# Patient Record
Sex: Male | Born: 1935 | Race: White | Hispanic: No | Marital: Married | Smoking: Never smoker
Health system: Southern US, Community
[De-identification: ages and names within clinical notes are randomized; demographics above are authoritative.]

## PROBLEM LIST (undated history)

## (undated) DIAGNOSIS — J189 Pneumonia, unspecified organism: Secondary | ICD-10-CM

## (undated) DIAGNOSIS — M19019 Primary osteoarthritis, unspecified shoulder: Secondary | ICD-10-CM

## (undated) DIAGNOSIS — M1990 Unspecified osteoarthritis, unspecified site: Secondary | ICD-10-CM

## (undated) DIAGNOSIS — I5022 Chronic systolic (congestive) heart failure: Secondary | ICD-10-CM

## (undated) DIAGNOSIS — J302 Other seasonal allergic rhinitis: Secondary | ICD-10-CM

## (undated) DIAGNOSIS — N400 Enlarged prostate without lower urinary tract symptoms: Secondary | ICD-10-CM

## (undated) DIAGNOSIS — I219 Acute myocardial infarction, unspecified: Secondary | ICD-10-CM

## (undated) DIAGNOSIS — K219 Gastro-esophageal reflux disease without esophagitis: Secondary | ICD-10-CM

## (undated) DIAGNOSIS — I10 Essential (primary) hypertension: Secondary | ICD-10-CM

## (undated) DIAGNOSIS — I255 Ischemic cardiomyopathy: Secondary | ICD-10-CM

## (undated) DIAGNOSIS — E785 Hyperlipidemia, unspecified: Secondary | ICD-10-CM

## (undated) DIAGNOSIS — I2510 Atherosclerotic heart disease of native coronary artery without angina pectoris: Secondary | ICD-10-CM

## (undated) HISTORY — DX: Chronic systolic (congestive) heart failure: I50.22

## (undated) HISTORY — PX: FOOT SURGERY: SHX648

---

## 1997-09-22 ENCOUNTER — Other Ambulatory Visit: Admission: RE | Admit: 1997-09-22 | Discharge: 1997-09-22 | Payer: Self-pay | Admitting: Gastroenterology

## 1997-09-23 DIAGNOSIS — D126 Benign neoplasm of colon, unspecified: Secondary | ICD-10-CM | POA: Insufficient documentation

## 1999-04-02 ENCOUNTER — Other Ambulatory Visit: Admission: RE | Admit: 1999-04-02 | Discharge: 1999-04-02 | Payer: Self-pay | Admitting: Gastroenterology

## 1999-04-02 ENCOUNTER — Encounter (INDEPENDENT_AMBULATORY_CARE_PROVIDER_SITE_OTHER): Payer: Self-pay | Admitting: Specialist

## 1999-05-05 ENCOUNTER — Emergency Department (HOSPITAL_COMMUNITY): Admission: EM | Admit: 1999-05-05 | Discharge: 1999-05-05 | Payer: Self-pay | Admitting: Emergency Medicine

## 2002-10-04 ENCOUNTER — Emergency Department (HOSPITAL_COMMUNITY): Admission: EM | Admit: 2002-10-04 | Discharge: 2002-10-04 | Payer: Self-pay | Admitting: Emergency Medicine

## 2006-09-26 ENCOUNTER — Ambulatory Visit: Payer: Self-pay | Admitting: Gastroenterology

## 2006-09-26 LAB — CONVERTED CEMR LAB
Alkaline Phosphatase: 74 units/L (ref 39–117)
BUN: 15 mg/dL (ref 6–23)
Basophils Absolute: 0 10*3/uL (ref 0.0–0.1)
Bilirubin, Direct: 0.1 mg/dL (ref 0.0–0.3)
CO2: 28 meq/L (ref 19–32)
Creatinine, Ser: 1.2 mg/dL (ref 0.4–1.5)
Eosinophils Absolute: 0.2 10*3/uL (ref 0.0–0.6)
Eosinophils Relative: 3.4 % (ref 0.0–5.0)
GFR calc Af Amer: 77 mL/min
Iron: 61 ug/dL (ref 42–165)
Lymphocytes Relative: 16.6 % (ref 12.0–46.0)
MCHC: 34.4 g/dL (ref 30.0–36.0)
MCV: 92.6 fL (ref 78.0–100.0)
Neutro Abs: 4.1 10*3/uL (ref 1.4–7.7)
Neutrophils Relative %: 70.8 % (ref 43.0–77.0)
Platelets: 187 10*3/uL (ref 150–400)
Potassium: 4 meq/L (ref 3.5–5.1)
RBC: 4.49 M/uL (ref 4.22–5.81)
Saturation Ratios: 20.2 % (ref 20.0–50.0)
Sodium: 142 meq/L (ref 135–145)
Total Bilirubin: 1 mg/dL (ref 0.3–1.2)
Total Protein: 6.8 g/dL (ref 6.0–8.3)
Transferrin: 215.4 mg/dL (ref >=212.0)

## 2006-10-23 ENCOUNTER — Ambulatory Visit: Payer: Self-pay | Admitting: Gastroenterology

## 2006-10-30 ENCOUNTER — Ambulatory Visit: Payer: Self-pay | Admitting: Gastroenterology

## 2006-10-30 DIAGNOSIS — K648 Other hemorrhoids: Secondary | ICD-10-CM | POA: Insufficient documentation

## 2007-03-19 DIAGNOSIS — E785 Hyperlipidemia, unspecified: Secondary | ICD-10-CM | POA: Insufficient documentation

## 2007-03-19 DIAGNOSIS — K623 Rectal prolapse: Secondary | ICD-10-CM | POA: Insufficient documentation

## 2007-03-19 DIAGNOSIS — K6289 Other specified diseases of anus and rectum: Secondary | ICD-10-CM | POA: Insufficient documentation

## 2007-03-19 DIAGNOSIS — K603 Anal fistula, unspecified: Secondary | ICD-10-CM | POA: Insufficient documentation

## 2007-03-19 DIAGNOSIS — K5900 Constipation, unspecified: Secondary | ICD-10-CM | POA: Insufficient documentation

## 2007-03-19 DIAGNOSIS — K625 Hemorrhage of anus and rectum: Secondary | ICD-10-CM | POA: Insufficient documentation

## 2007-03-19 DIAGNOSIS — I10 Essential (primary) hypertension: Secondary | ICD-10-CM | POA: Insufficient documentation

## 2007-03-19 DIAGNOSIS — J309 Allergic rhinitis, unspecified: Secondary | ICD-10-CM | POA: Insufficient documentation

## 2008-10-13 ENCOUNTER — Telehealth: Payer: Self-pay | Admitting: Gastroenterology

## 2008-10-15 ENCOUNTER — Telehealth: Payer: Self-pay | Admitting: Gastroenterology

## 2009-01-13 ENCOUNTER — Ambulatory Visit: Payer: Self-pay | Admitting: Gastroenterology

## 2009-10-27 ENCOUNTER — Telehealth: Payer: Self-pay | Admitting: Gastroenterology

## 2010-02-02 NOTE — Progress Notes (Signed)
Summary: TAlk to nurse   Phone Note Call from Patient Call back at Home Phone [phone] Call back at [phone] cell   Call For: Dr Jarold Motto Summary of Call: Is having a minor problem he wants to discuss with nurse a knot in a certain area, would rather tell a nurse. Initial call taken by: Leanor [facility],  October 27, 2009 11:12 AM  Follow-up for Phone Call        Patient c/o knot near his rectum he thinks it is a boil.  He says it is under the skin and not in the rectum.  He is advised to see his primary care as it is not on or in the rectum. Follow-up by: Darcey Nora RN, CGRN,  October 27, 2009 11:29 AM

## 2010-02-02 NOTE — Assessment & Plan Note (Signed)
Summary: YEARLY FOLLOW UP/YF    History of Present Illness Visit Type: Follow-up Visit Primary GI MD: Sheryn Bison MD FACP FAGA Primary Provider: Renford Dills, MD Chief Complaint: Hemmorhoids, lots of itching & rash History of Present Illness:   He denies GI complaints except for periodic rectal itching and discomfort without bleeding, rectal or abdominal pain. His bowel movements are regular and his last colonoscopy was 2 years ago. He denies other gastrointestinal complaints. He does have a history of BPH and is followed by urology.   GI Review of Systems      Denies abdominal pain, acid reflux, belching, bloating, chest pain, dysphagia with liquids, dysphagia with solids, heartburn, loss of appetite, nausea, vomiting, vomiting blood, weight loss, and  weight gain.      Reports hemorrhoids.     Denies anal fissure, black tarry stools, change in bowel habit, constipation, diarrhea, diverticulosis, fecal incontinence, heme positive stool, irritable bowel syndrome, jaundice, light color stool, liver problems, rectal bleeding, and  rectal pain. Preventive Screening-Counseling & Management  Alcohol-Tobacco     Smoking Status: never      Drug Use:  no.      Current Medications (verified): 1)  Analpram E 2.5-1 & 1 % Kit (Hydrocortisone Ace-Pramoxine) .... Apply To Rectal Area Tid 2)  Lipitor 10 Mg Tabs (Atorvastatin Calcium) .... Once Daily 3)  Advil Migraine 200 Mg Caps (Ibuprofen) .... As Needed  Allergies (verified): No Known Drug Allergies  Past History:  Past medical, surgical, family and social histories (including risk factors) reviewed for relevance to current acute and chronic problems.  Past Medical History: Reviewed history from 03/19/2007 and no changes required. Current Problems:  Hx of ADENOMATOUS COLONIC POLYP (ICD-211.3) PROCTITIS (ICD-569.49) HYPERLIPIDEMIA (ICD-272.4) ANAL FISTULA (ICD-565.1) Hx of PROLAPSE, RECTAL (ICD-569.1) HEMORRHOIDS, INTERNAL  (ICD-455.0) HISTORY OF PREVIOUS HAMARTOMATOUS ANAL POLYP  Past Surgical History: Tonsillectomy TURP  Family History: Reviewed history and no changes required. No FH of Colon Cancer:  Social History: Reviewed history and no changes required. Occupation: Retired Patient has never smoked.  Alcohol Use - no Daily Caffeine Use Illicit Drug Use - no Smoking Status:  never Drug Use:  no  Review of Systems       The patient complains of allergy/sinus, headaches-new, itching, muscle pains/cramps, and skin rash.  The patient denies anemia, anxiety-new, arthritis/joint pain, back pain, blood in urine, breast changes/lumps, change in vision, confusion, cough, coughing up blood, depression-new, fainting, fatigue, fever, hearing problems, heart murmur, heart rhythm changes, menstrual pain, night sweats, nosebleeds, pregnancy symptoms, shortness of breath, sleeping problems, sore throat, swelling of feet/legs, swollen lymph glands, thirst - excessive , urination - excessive , urination changes/pain, urine leakage, vision changes, and voice change.    Vital Signs:  Patient profile:   75 year old male Height:      71 inches Weight:      176.38 pounds BMI:     24.69 Pulse rate:   68 / minute Pulse rhythm:   regular BP sitting:   138 / 82  (left arm) Cuff size:   regular  Vitals Entered By: June McMurray CMA Duncan Dull) (January 13, 2009 1:58 PM)  Physical Exam  General:  Well developed, well nourished, no acute distress.healthy appearing.   Head:  Normocephalic and atraumatic. Eyes:  PERRLA, no icterus.exam deferred to patient's ophthalmologist.   Abdomen:  Soft, nontender and nondistended. No masses, hepatosplenomegaly or hernias noted. Normal bowel sounds. Rectal:  there is a posterior fistula in the midline which is noninflamed  and not draining, is not tender, and rectal exam otherwise is normal. There are hemorrhoids and anal canal which are noninflamed or bleeding. Stool is guaiac  negative. Prostate:  prostate is enlarged and somewhat irregular. Neurologic:  Alert and  oriented x4;  grossly normal neurologically.   Impression & Recommendations:  Problem # 1:  Hx of CONSTIPATION (ICD-564.00) Assessment Improved continue high-fiber diet and liberal p.o. fluids as tolerated  Problem # 2:  Hx of RECTAL BLEEDING (ICD-569.3) Assessment: Improved continue local anal tear and Analpram cream as needed  Problem # 3:  Hx of ADENOMATOUS COLONIC POLYP (ICD-211.3) Assessment: Unchanged he is up-to-date on his colonoscopy exams and I see no need to repeat at this time  Problem # 4:  ANAL FISTULA (ICD-565.1) Assessment: Comment Only Review of his records and colonoscopy exam shows no evidence of inflammatory bowel disease.  Patient Instructions: 1)  Copy sent to : Dr. Renford Dills 2)  Please continue current medications.  3)  Constipation and Hemorrhoids brochure given.  4)  Please schedule a follow-up appointment as needed.  5)  Urologic followup suggested 6)  The medication list was reviewed and reconciled.  All changed / newly prescribed medications were explained.  A complete medication list was provided to the patient / caregiver. Prescriptions: ANALPRAM E 2.5-1 & 1 % KIT (HYDROCORTISONE ACE-PRAMOXINE) apply to rectal area three times a day prn  #1 x 3   Entered by:   Ashok Cordia RN   Authorized by:   Mardella Layman MD [facility]   Signed by:   Ashok Cordia RN on 01/13/2009   Method used:   Electronically to        [facility]  [address] [phone] (retail)       [address]       [place], [place]  [address]       Ph: [phone]       Fax: [phone]   RxID:   [id number]

## 2010-05-18 NOTE — Assessment & Plan Note (Signed)
[facility]                         GASTROENTEROLOGY OFFICE NOTE   NAME:Warner, Jesus VONDERHAAR                     MRN:          [id number]  DATE:09/26/2006                            DOB:          [birth year]    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   [name] has had rectal itching, occasional bright red blood for several  months which seemed to respond to some topical steroid creams.  He has a  long history of recurrent colon polyps, rectal prolapse, internal  hemorrhoids, and proctitis.  His last colonoscopy exam was performed in  2001.  He has a negative family history of colon polyps.  He is having  regular bowel movements, denies abdominal pain, or any GI symptomatology  whatsoever.  His appetite is good, and his weight is stable.   His only other medical problem is mild hyperlipidemia, and he is on 10  mg of Lipitor a day.   FAMILY HISTORY:  Noncontributory.   SOCIAL HISTORY:  He is retired from the [employer] and has a high  school education.  He lives with his wife, does not smoke or drink.   REVIEW OF SYSTEMS:  Entirely noncontributory.   MEDICATIONS:  1. Lipitor.  2. Cortisone p.r.n.   He denies drug allergies.   EXAM:  He is a healthy-appearing white male in no distress appearing his  stated age.  ABDOMEN:  Entirely unremarkable.  Inspection of the rectum showed some mild perianal erythema but no  nodularity or maceration.  There is a posterior fistula which is  nondraining and is not inflamed.  Some obvious hemorrhoidal tissue in  the anal canal, but rectal exam showed no masses or tenderness with soft  stool which is trace guaiac positive.  MENTAL STATUS:  Clear.  PERIPHERAL EXTREMITIES:  Unremarkable.   ASSESSMENT:  1. History of previous colon polyps with need for followup      colonoscopy.  2. Probable recurrent proctitis with a  history of rectal prolapse      syndrome.  3. Chronic anal fistula.  4. Mild hyperlipidemia.   RECOMMENDATIONS:  1. Screening laboratory parameters since he has not had blood work      done in the last year.  2. Canasa 1 g suppository q.h.s. with p.r.n. local Analpram cream.  3. Followup colonoscopy exam at his convenience.  4. High fiber diet as tolerated.     Vania Rea. Jarold Motto, MD, Caleen Essex, FAGA  Electronically Signed    DRP/MedQ  DD: 09/26/2006  DT: 09/26/2006  Job #: [id number]   cc:   Deirdre Peer. Polite, M.D.

## 2012-09-06 ENCOUNTER — Inpatient Hospital Stay (HOSPITAL_COMMUNITY)
Admission: EM | Admit: 2012-09-06 | Discharge: 2012-09-09 | DRG: 246 | Disposition: A | Discharge status: Home / Self Care | Payer: Medicare Other | Attending: Cardiovascular Disease | Admitting: Cardiovascular Disease

## 2012-09-06 ENCOUNTER — Encounter (HOSPITAL_COMMUNITY)
Admission: EM | Disposition: A | Discharge status: Home / Self Care | Payer: Self-pay | Source: Home / Self Care | Attending: Cardiovascular Disease

## 2012-09-06 ENCOUNTER — Other Ambulatory Visit: Payer: Self-pay

## 2012-09-06 ENCOUNTER — Encounter (HOSPITAL_COMMUNITY): Payer: Self-pay | Admitting: Nurse Practitioner

## 2012-09-06 DIAGNOSIS — I2789 Other specified pulmonary heart diseases: Secondary | ICD-10-CM | POA: Diagnosis present

## 2012-09-06 DIAGNOSIS — I2582 Chronic total occlusion of coronary artery: Secondary | ICD-10-CM | POA: Diagnosis present

## 2012-09-06 DIAGNOSIS — I2589 Other forms of chronic ischemic heart disease: Secondary | ICD-10-CM | POA: Diagnosis present

## 2012-09-06 DIAGNOSIS — I4949 Other premature depolarization: Secondary | ICD-10-CM | POA: Diagnosis present

## 2012-09-06 DIAGNOSIS — I10 Essential (primary) hypertension: Secondary | ICD-10-CM | POA: Diagnosis present

## 2012-09-06 DIAGNOSIS — I5021 Acute systolic (congestive) heart failure: Secondary | ICD-10-CM

## 2012-09-06 DIAGNOSIS — I519 Heart disease, unspecified: Secondary | ICD-10-CM

## 2012-09-06 DIAGNOSIS — I252 Old myocardial infarction: Secondary | ICD-10-CM

## 2012-09-06 DIAGNOSIS — Z836 Family history of other diseases of the respiratory system: Secondary | ICD-10-CM

## 2012-09-06 DIAGNOSIS — E785 Hyperlipidemia, unspecified: Secondary | ICD-10-CM | POA: Diagnosis present

## 2012-09-06 DIAGNOSIS — J301 Allergic rhinitis due to pollen: Secondary | ICD-10-CM | POA: Diagnosis present

## 2012-09-06 DIAGNOSIS — Z7902 Long term (current) use of antithrombotics/antiplatelets: Secondary | ICD-10-CM

## 2012-09-06 DIAGNOSIS — Z79899 Other long term (current) drug therapy: Secondary | ICD-10-CM

## 2012-09-06 DIAGNOSIS — I509 Heart failure, unspecified: Secondary | ICD-10-CM | POA: Diagnosis present

## 2012-09-06 DIAGNOSIS — Z955 Presence of coronary angioplasty implant and graft: Secondary | ICD-10-CM

## 2012-09-06 DIAGNOSIS — I2109 ST elevation (STEMI) myocardial infarction involving other coronary artery of anterior wall: Secondary | ICD-10-CM

## 2012-09-06 DIAGNOSIS — I2510 Atherosclerotic heart disease of native coronary artery without angina pectoris: Secondary | ICD-10-CM

## 2012-09-06 DIAGNOSIS — I255 Ischemic cardiomyopathy: Secondary | ICD-10-CM | POA: Diagnosis present

## 2012-09-06 DIAGNOSIS — Z7982 Long term (current) use of aspirin: Secondary | ICD-10-CM

## 2012-09-06 DIAGNOSIS — N400 Enlarged prostate without lower urinary tract symptoms: Secondary | ICD-10-CM | POA: Diagnosis present

## 2012-09-06 HISTORY — DX: Essential (primary) hypertension: I10

## 2012-09-06 HISTORY — DX: Ischemic cardiomyopathy: I25.5

## 2012-09-06 HISTORY — PX: PERCUTANEOUS CORONARY STENT INTERVENTION (PCI-S): SHX5485

## 2012-09-06 HISTORY — DX: Hyperlipidemia, unspecified: E78.5

## 2012-09-06 HISTORY — DX: Other seasonal allergic rhinitis: J30.2

## 2012-09-06 HISTORY — PX: CORONARY ANGIOPLASTY WITH STENT PLACEMENT: SHX49

## 2012-09-06 HISTORY — PX: LEFT HEART CATHETERIZATION WITH CORONARY ANGIOGRAM: SHX5451

## 2012-09-06 HISTORY — DX: Atherosclerotic heart disease of native coronary artery without angina pectoris: I25.10

## 2012-09-06 HISTORY — DX: Benign prostatic hyperplasia without lower urinary tract symptoms: N40.0

## 2012-09-06 LAB — CBC
Hemoglobin: 13.7 g/dL (ref 13.0–17.0)
MCH: 31.4 pg (ref 26.0–34.0)
MCHC: 35.3 g/dL (ref 30.0–36.0)
RDW: 13.2 % (ref 11.5–15.5)

## 2012-09-06 LAB — POCT I-STAT, CHEM 8
Chloride: 106 mEq/L (ref 96–112)
Glucose, Bld: 152 mg/dL — ABNORMAL HIGH (ref 70–99)
HCT: 40 % (ref 39.0–52.0)
Hemoglobin: 13.6 g/dL (ref 13.0–17.0)
Potassium: 3.4 mEq/L — ABNORMAL LOW (ref 3.5–5.1)
Sodium: 141 mEq/L (ref 135–145)

## 2012-09-06 LAB — LIPID PANEL
Cholesterol: 144 mg/dL (ref 0–200)
HDL: 42 mg/dL (ref >39)
Total CHOL/HDL Ratio: 3.4 RATIO
Triglycerides: 115 mg/dL (ref <150)
VLDL: 23 mg/dL (ref 0–40)

## 2012-09-06 LAB — COMPREHENSIVE METABOLIC PANEL
ALT: 19 U/L (ref 0–53)
AST: 24 U/L (ref 0–37)
Albumin: 3.4 g/dL — ABNORMAL LOW (ref 3.5–5.2)
Albumin: 3.3 g/dL — ABNORMAL LOW (ref 3.5–5.2)
Alkaline Phosphatase: 80 U/L (ref 39–117)
BUN: 17 mg/dL (ref 6–23)
BUN: 14 mg/dL (ref 6–23)
CO2: 23 mEq/L (ref 19–32)
Chloride: 104 mEq/L (ref 96–112)
Chloride: 105 mEq/L (ref 96–112)
Creatinine, Ser: 0.89 mg/dL (ref 0.50–1.35)
GFR calc Af Amer: >90 mL/min (ref >90)
GFR calc non Af Amer: 71 mL/min — ABNORMAL LOW (ref >90)
GFR calc non Af Amer: 82 mL/min — ABNORMAL LOW (ref >90)
Glucose, Bld: 156 mg/dL — ABNORMAL HIGH (ref 70–99)
Glucose, Bld: 140 mg/dL — ABNORMAL HIGH (ref 70–99)
Potassium: 3.3 mEq/L — ABNORMAL LOW (ref 3.5–5.1)
Total Bilirubin: 0.4 mg/dL (ref 0.3–1.2)
Total Protein: 6.4 g/dL (ref 6.0–8.3)

## 2012-09-06 LAB — PROTIME-INR
INR: 1.07 (ref 0.00–1.49)
Prothrombin Time: 13.6 seconds (ref 11.6–15.2)

## 2012-09-06 LAB — CK TOTAL AND CKMB (NOT AT ARMC): Total CK: 135 U/L (ref 7–232)

## 2012-09-06 LAB — CBC WITH DIFFERENTIAL/PLATELET
Basophils Absolute: 0 10*3/uL (ref 0.0–0.1)
Lymphocytes Relative: 10 % — ABNORMAL LOW (ref 12–46)
Lymphs Abs: 0.7 10*3/uL (ref 0.7–4.0)
Neutro Abs: 6.5 10*3/uL (ref 1.7–7.7)
Neutrophils Relative %: 85 % — ABNORMAL HIGH (ref 43–77)
Platelets: 170 10*3/uL (ref 150–400)
RBC: 4.36 MIL/uL (ref 4.22–5.81)
RDW: 13.3 % (ref 11.5–15.5)
WBC: 7.7 10*3/uL (ref 4.0–10.5)

## 2012-09-06 LAB — HEMOGLOBIN A1C: Mean Plasma Glucose: 120 mg/dL — ABNORMAL HIGH (ref <117)

## 2012-09-06 LAB — APTT: aPTT: 25 seconds (ref 24–37)

## 2012-09-06 LAB — TROPONIN I
Troponin I: 0.88 ng/mL (ref <0.30)
Troponin I: 15.79 ng/mL (ref <0.30)

## 2012-09-06 LAB — POCT ACTIVATED CLOTTING TIME: Activated Clotting Time: 263 seconds

## 2012-09-06 LAB — PLATELET COUNT: Platelets: 171 10*3/uL (ref 150–400)

## 2012-09-06 SURGERY — LEFT HEART CATHETERIZATION WITH CORONARY ANGIOGRAM
Anesthesia: LOCAL

## 2012-09-06 MED ORDER — ASPIRIN EC 81 MG PO TBEC
81.0000 mg | DELAYED_RELEASE_TABLET | Freq: Every day | ORAL | Status: DC
  Administered 2012-09-07 – 2012-09-09 (×3): 81 mg via ORAL
  Filled 2012-09-06 (×3): qty 1

## 2012-09-06 MED ORDER — TICAGRELOR 90 MG PO TABS
ORAL_TABLET | ORAL | Status: AC
  Filled 2012-09-06: qty 2

## 2012-09-06 MED ORDER — NITROGLYCERIN 0.4 MG SL SUBL: 0.4000 mg | SUBLINGUAL_TABLET | SUBLINGUAL | Status: DC | PRN

## 2012-09-06 MED ORDER — POTASSIUM CHLORIDE CRYS ER 20 MEQ PO TBCR
40.0000 meq | EXTENDED_RELEASE_TABLET | Freq: Once | ORAL | Status: AC
  Administered 2012-09-06: 40 meq via ORAL
  Filled 2012-09-06: qty 2

## 2012-09-06 MED ORDER — OXYCODONE-ACETAMINOPHEN 5-325 MG PO TABS: 1.0000 | ORAL_TABLET | ORAL | Status: DC | PRN

## 2012-09-06 MED ORDER — NITROGLYCERIN 0.2 MG/ML ON CALL CATH LAB
INTRAVENOUS | Status: AC
  Filled 2012-09-06: qty 1

## 2012-09-06 MED ORDER — ALPRAZOLAM 0.25 MG PO TABS: 0.2500 mg | ORAL_TABLET | Freq: Two times a day (BID) | ORAL | Status: DC | PRN

## 2012-09-06 MED ORDER — FENTANYL CITRATE 0.05 MG/ML IJ SOLN
INTRAMUSCULAR | Status: AC
  Filled 2012-09-06: qty 2

## 2012-09-06 MED ORDER — LOSARTAN POTASSIUM 50 MG PO TABS
50.0000 mg | ORAL_TABLET | Freq: Every day | ORAL | Status: DC
  Administered 2012-09-06 – 2012-09-09 (×4): 50 mg via ORAL
  Filled 2012-09-06 (×4): qty 1

## 2012-09-06 MED ORDER — MORPHINE SULFATE 2 MG/ML IJ SOLN: 2.0000 mg | INTRAMUSCULAR | Status: DC | PRN

## 2012-09-06 MED ORDER — HEPARIN SODIUM (PORCINE) 1000 UNIT/ML IJ SOLN
INTRAMUSCULAR | Status: AC
  Filled 2012-09-06: qty 1

## 2012-09-06 MED ORDER — HEPARIN (PORCINE) IN NACL 2-0.9 UNIT/ML-% IJ SOLN
INTRAMUSCULAR | Status: AC
  Filled 2012-09-06: qty 1000

## 2012-09-06 MED ORDER — TICAGRELOR 90 MG PO TABS
90.0000 mg | ORAL_TABLET | Freq: Two times a day (BID) | ORAL | Status: DC
  Administered 2012-09-06 – 2012-09-09 (×7): 90 mg via ORAL
  Filled 2012-09-06 (×7): qty 1

## 2012-09-06 MED ORDER — ONDANSETRON HCL 4 MG/2ML IJ SOLN: 4.0000 mg | Freq: Four times a day (QID) | INTRAMUSCULAR | Status: DC | PRN

## 2012-09-06 MED ORDER — EPTIFIBATIDE 75 MG/100ML IV SOLN
2.0000 ug/kg/min | INTRAVENOUS | Status: AC
  Administered 2012-09-06: 2 ug/kg/min via INTRAVENOUS
  Filled 2012-09-06 (×2): qty 100

## 2012-09-06 MED ORDER — ACETAMINOPHEN 325 MG PO TABS: 650.0000 mg | ORAL_TABLET | ORAL | Status: DC | PRN

## 2012-09-06 MED ORDER — ZOLPIDEM TARTRATE 5 MG PO TABS
5.0000 mg | ORAL_TABLET | Freq: Every evening | ORAL | Status: DC | PRN
  Administered 2012-09-06 – 2012-09-08 (×3): 5 mg via ORAL
  Filled 2012-09-06 (×3): qty 1

## 2012-09-06 MED ORDER — ATORVASTATIN CALCIUM 80 MG PO TABS
80.0000 mg | ORAL_TABLET | Freq: Every day | ORAL | Status: DC
  Administered 2012-09-06 – 2012-09-09 (×4): 80 mg via ORAL
  Filled 2012-09-06 (×4): qty 1

## 2012-09-06 MED ORDER — SODIUM CHLORIDE 0.9 % IJ SOLN
3.0000 mL | Freq: Two times a day (BID) | INTRAMUSCULAR | Status: DC
  Administered 2012-09-06 – 2012-09-08 (×6): 3 mL via INTRAVENOUS

## 2012-09-06 MED ORDER — SODIUM CHLORIDE 0.9 % IV SOLN: INTRAVENOUS | Status: AC

## 2012-09-06 MED ORDER — LIDOCAINE HCL (PF) 1 % IJ SOLN
INTRAMUSCULAR | Status: AC
  Filled 2012-09-06: qty 30

## 2012-09-06 MED ORDER — MIDAZOLAM HCL 2 MG/2ML IJ SOLN
INTRAMUSCULAR | Status: AC
  Filled 2012-09-06: qty 2

## 2012-09-06 MED ORDER — EPTIFIBATIDE 75 MG/100ML IV SOLN
INTRAVENOUS | Status: AC
  Filled 2012-09-06: qty 100

## 2012-09-06 MED ORDER — SODIUM CHLORIDE 0.9 % IV SOLN: 250.0000 mL | INTRAVENOUS | Status: DC | PRN

## 2012-09-06 MED ORDER — VERAPAMIL HCL 2.5 MG/ML IV SOLN
INTRAVENOUS | Status: AC
  Filled 2012-09-06: qty 2

## 2012-09-06 MED ORDER — SODIUM CHLORIDE 0.9 % IJ SOLN
3.0000 mL | INTRAMUSCULAR | Status: DC | PRN
  Administered 2012-09-08: 3 mL via INTRAVENOUS

## 2012-09-06 NOTE — Progress Notes (Signed)
ANTICOAGULATION CONSULT NOTE - Initial Consult  Pharmacy Consult for integrilin x 12 hrs Indication: post PTCA  No Known Allergies  Patient Measurements: Height: 5\' 10"  (177.8 cm) Weight: 168 lb 8 oz (76.431 kg) IBW/kg (Calculated) : 73   Vital Signs: BP: 139/77 mmHg (09/04 1330) Pulse Rate: 62 (09/04 1330)  Labs:  Recent Labs  09/06/12 1147  HGB 13.7  HCT 38.8*  PLT 171  APTT 25  LABPROT 13.7  INR 1.07  CREATININE 1.00  CKTOTAL 135  CKMB 6.5*  TROPONINI 0.88*    Estimated Creatinine Clearance: 65.9 ml/min (by C-G formula based on Cr of 1).   Medical History: Past Medical History  Diagnosis Date  . Hyperlipidemia   . HTN (hypertension)   . Seasonal allergies   . BPH (benign prostatic hyperplasia)     Assessment: Jesus Warner is a 77 yo WM transferred from MD office to cath lab as a code STEMI.  He had a total occlusion of LAD successfully treated with PCI with DES.  He is to continue integrilin x 12 hrs post PTCA.  His baseline CBC is WNL.  Wt 76.4. Kg. Creat cl ~ 66 ml/min.  Integrilin started at ~ 1150 am.    Goal of Therapy:   Monitor platelets by anticoagulation protocol: Yes   Plan:  1. integrilin 2 mcg/kg/min x 12 hrs post PTCA 2. Monitor platelet count Herby Abraham, Pharm.D. [phone] 09/06/2012 1:41 PM   Len Childs T 09/06/2012,1:37 PM

## 2012-09-06 NOTE — H&P (Signed)
Patient ID: Jesus Warner MRN: [id number], DOB/AGE: 05-17-1935   Admit date: 09/06/2012   Primary Physician: Darrol Jump, MD Primary Cardiologist: new   Pt. Profile:  77 y/o male without prior cardiac hx who presents acutely with ant STEMI.  Problem List  Past Medical History  Diagnosis Date  . Hyperlipidemia   . HTN (hypertension)   . Seasonal allergies   . BPH (benign prostatic hyperplasia)     No past surgical history on file.   Allergies  Allergies not on file  HPI  77 y/o male w/o prior cardiac hx.  He was in his usoh until last PM, when while @ church, he began to experience sscp w/o associated Ss.  C/P eventually resolved, though he is unclear as to duration.  This AM, after eating breakfast, he developed severe retrosternal chest pressure assoc w diaphoresis.  He drove to his PCP's office where an ECG showed ant ST elevation.  EMS was called and he was treated with asa and ntg.  Following ntg, he became hypotensive.  He was taken to [facility] cath lab for [facility] STEMI.  He cont to report moderate c/p.  Home Medications  Asa 81 qd lotrisone cream bid Allegra 180 qd flonase 50 mcg 2 sp each nostril qd benicar 20 qd ---> recently switched to losartan per clinic note 7/29. lipitor 10 qd  Family History  Family History  Problem Relation Age of Onset  . Emphysema Father     died @ 22  . Cancer Mother     died @ 69   Social History  History   Social History  . Marital Status: Married    Spouse Name: N/A    Number of Children: N/A  . Years of Education: N/A   Occupational History  . Not on file.   Social History Main Topics  . Smoking status: Never Smoker   . Smokeless tobacco: Not on file  . Alcohol Use: No  . Drug Use: No  . Sexual Activity: Not on file   Other Topics Concern  . Not on file   Social History Narrative   Lives at home with wife in [place]    Review of Systems General:  No chills, fever, night sweats or weight changes.    Cardiovascular:  +++ chest pain, no dyspnea on exertion, edema, orthopnea, palpitations, paroxysmal nocturnal dyspnea. Dermatological: No rash, lesions/masses Respiratory: No cough, dyspnea Urologic: No hematuria, dysuria Abdominal:   No nausea, vomiting, diarrhea, bright red blood per rectum, melena, or hematemesis Neurologic:  No visual changes, wkns, changes in mental status. All other systems reviewed and are otherwise negative except as noted above.  Physical Exam  Afebrile, 54, 124/85, spo2 95, 2lpm  General: Pleasant, NAD, mildly diaphoretic Psych: Normal affect. Neuro: Alert and oriented X 3. Moves all extremities spontaneously. HEENT: Normal  Neck: Supple without bruits or JVD. Lungs:  Resp regular and unlabored, CTA. Heart: RRR no s3, s4, or murmurs. Abdomen: Soft, non-tender, non-distended, BS + x 4.  Extremities: No clubbing, cyanosis or edema. DP/PT/Radials 2+ and equal bilaterally.  Labs  All labs pending  Radiology/Studies  pending  ECG  Sb, 57, 1st deg hb, 4mm ant ST elevation with 2mm ST elevation in V5-6 as well.  ASSESSMENT AND PLAN  1.  Acute Ant STEMI:  Emergent cath ongoing.  Prelim - occluded LAD.  Cont asa, loading with brilinta in lab.  Inc lipitor to 80.  No BB in setting of baseline bradycardia.  Eventual  cardiac rehab.  F/U echo.  2.  HTN:  Cont ARB.  Follow.  3.  HL:  Check lipids/lft's.  Inc lipitor to 80.  4.  Seasonal Allergies: cont home meds.  Signed, Nicolasa Ducking, NP 09/06/2012, 11:42 AM'  Patient seen, examined. Available data reviewed. Agree with findings, assessment, and plan as outlined by Ward Givens, NP. Pt with no cardiac history presenting with acute anterior STEMI and ongoing CP. Exam reveals pale, diaphoretic, stoic gentleman. Lungs clear, heart brady and regular, no peripheral edema. Plan emergency cath and PCI. Further plans pending cath results.  Tonny Bollman, M.D. 09/06/2012 12:27 PM

## 2012-09-06 NOTE — Progress Notes (Signed)
Echocardiogram 2D Echocardiogram has been performed.  Orvella Digiulio 09/06/2012, 3:03 PM

## 2012-09-06 NOTE — Care Management Note (Signed)
    Page 1 of 1   09/06/2012     1:42:42 PM   CARE MANAGEMENT NOTE 09/06/2012  Patient:  Jesus Warner, Jesus Warner   Account Number:  [id number]  Date Initiated:  09/06/2012  Documentation initiated by:  Junius Creamer  Subjective/Objective Assessment:   adm w mi     Action/Plan:   lives w wife   Anticipated DC Date:     Anticipated DC Plan:        DC Planning Services  CM consult  Medication Assistance      Choice offered to / List presented to:             Status of service:   Medicare Important Message given?   (If response is "NO", the following Medicare IM given date fields will be blank) Date Medicare IM given:   Date Additional Medicare IM given:    Discharge Disposition:    Per UR Regulation:  Reviewed for med. necessity/level of care/duration of stay  If discussed at Long Length of Stay Meetings, dates discussed:    Comments:  9/4 1341 debbie Corazon Nickolas rn,bsn pt given 30day brilinta card and copay assist card. wife states has ins that covers meds.

## 2012-09-06 NOTE — CV Procedure (Signed)
   Cardiac Catheterization Procedure Note  Name: Jesus Warner MRN: [id number] DOB: Nov 11, 1935  Procedure: Left Heart Cath, Selective Coronary Angiography, LV angiography, Aspiration thrombectomy and stenting of the proximal LAD  Indication: Anterior STEMI  Procedural Details:  The right wrist was prepped, draped, and anesthetized with 1% lidocaine. Using the modified Seldinger technique, a 5/6 French slender sheath was introduced into the right radial artery. 3 mg of verapamil was administered through the sheath, weight-based unfractionated heparin was administered intravenously. Standard Judkins catheters were used for selective coronary angiography and left ventriculography. Catheter exchanges were performed over an exchange length guidewire.  PROCEDURAL FINDINGS Hemodynamics: AO 95/64 LV 96/14   Coronary angiography: Coronary dominance: right  Left mainstem: Arises from the left cusp, divides into the LAD and LCx. No significant stenosis.  Left anterior descending (LAD): Large vessel. Total occlusion at the origin of the 1st septal perforator and first diagonal. After reperfusion, the LAD is visualized and it wraps around the LV apex. There is discrete 40% distal LAD stenosis.  Left circumflex (LCx): small vessel. 80% ostial stenosis. Supplies only a single OM branch.  Right coronary artery (RCA): Large, dominant vessel. Widely patent throughout. Minimal irregularity in the mid-RCA is present. The PDA and PLA branches are patent without significant disease.  Left ventriculography: Left ventricular systolic function is moderately reduced with akinesis of the distal anterior, apical, and inferoapical walls. The LVEF is estimated at 40%.  PCI Note:  Following the diagnostic procedure, the decision was made to proceed with PCI. Heparin and intergrilin were administered for anticoagulation. He was loaded with brilinta 180 mg orally. Once a therapeutic ACT was achieved, a 6 [place]  XB-LAD guide catheter was inserted.  A BMW coronary guidewire was used to cross the lesion.  There was heavy thrombus at the lesion site. Aspiration thrombectomy was performed with mild improvement but residual thrombus was present.  The lesion was then stented with a 3.0x18 mm Xience Xpedition drug-eluting stent.  The stent was postdilated with a 3.5 mm noncompliant balloon.  Following PCI, there was 0% residual stenosis and TIMI-3 flow. Final angiography confirmed an excellent result. The patient tolerated the procedure well. There were no immediate procedural complications. A TR band was used for radial hemostasis. The patient was transferred to the post catheterization recovery area for further monitoring.  PCI Data: Vessel - LAD/Segment - mid Percent Stenosis (pre)  100 TIMI-flow 0 Stent 3.0x18 mm Xience DES Percent Stenosis (post) 0 TIMI-flow (post) 3  Final Conclusions:   1. Total LAD occlusion treated successfully with primary PCI using a DES platform  2. Moderate ostial LCx stenosis 3. Widely patent RCA 4. Moderate LV dysfunction consistent with anterior MI   Recommendations:  ASA, brilinta x 12 months. Post-MI medical therapy. LCx is a small vessel and I would anticipate medical therapy for residual CAD.  Tonny Bollman 09/06/2012, 12:30 PM

## 2012-09-06 NOTE — Interval H&P Note (Signed)
History and Physical Interval Note:  09/06/2012 12:29 PM  Jesus Warner  has presented today for surgery, with the diagnosis of stemi  The various methods of treatment have been discussed with the patient and family. After consideration of risks, benefits and other options for treatment, the patient has consented to  Procedure(s): LEFT HEART CATHETERIZATION WITH CORONARY ANGIOGRAM (N/A) PERCUTANEOUS CORONARY STENT INTERVENTION (PCI-S) as a surgical intervention .  The patient's history has been reviewed, patient examined, no change in status, stable for surgery.  I have reviewed the patient's chart and labs.  Questions were answered to the patient's satisfaction.    Cath Lab Visit (complete for each Cath Lab visit)  Clinical Evaluation Leading to the Procedure:   ACS: yes  Non-ACS:    Anginal Classification: CCS IV  Anti-ischemic medical therapy: No Therapy  Non-Invasive Test Results: No non-invasive testing performed  Prior CABG: No previous CABG         Tonny Bollman

## 2012-09-07 ENCOUNTER — Inpatient Hospital Stay (HOSPITAL_COMMUNITY): Payer: Medicare Other

## 2012-09-07 DIAGNOSIS — I255 Ischemic cardiomyopathy: Secondary | ICD-10-CM | POA: Diagnosis present

## 2012-09-07 DIAGNOSIS — I509 Heart failure, unspecified: Secondary | ICD-10-CM

## 2012-09-07 DIAGNOSIS — I5021 Acute systolic (congestive) heart failure: Secondary | ICD-10-CM

## 2012-09-07 DIAGNOSIS — I10 Essential (primary) hypertension: Secondary | ICD-10-CM

## 2012-09-07 LAB — CBC
Hemoglobin: 13.2 g/dL (ref 13.0–17.0)
MCH: 31.8 pg (ref 26.0–34.0)
Platelets: 174 10*3/uL (ref 150–400)
RBC: 4.15 MIL/uL — ABNORMAL LOW (ref 4.22–5.81)

## 2012-09-07 LAB — BASIC METABOLIC PANEL
CO2: 24 mEq/L (ref 19–32)
Calcium: 8.7 mg/dL (ref 8.4–10.5)
GFR calc non Af Amer: 67 mL/min — ABNORMAL LOW (ref >90)
Glucose, Bld: 103 mg/dL — ABNORMAL HIGH (ref 70–99)
Potassium: 4.2 mEq/L (ref 3.5–5.1)
Sodium: 138 mEq/L (ref 135–145)

## 2012-09-07 LAB — LIPID PANEL
Cholesterol: 153 mg/dL (ref 0–200)
Triglycerides: 136 mg/dL (ref <150)

## 2012-09-07 LAB — TSH: TSH: 1.873 u[IU]/mL (ref 0.350–4.500)

## 2012-09-07 MED ORDER — SPIRONOLACTONE 25 MG PO TABS
25.0000 mg | ORAL_TABLET | Freq: Every day | ORAL | Status: DC
  Administered 2012-09-07 – 2012-09-09 (×3): 25 mg via ORAL
  Filled 2012-09-07 (×3): qty 1

## 2012-09-07 MED ORDER — CARVEDILOL 3.125 MG PO TABS
3.1250 mg | ORAL_TABLET | Freq: Two times a day (BID) | ORAL | Status: DC
  Administered 2012-09-07 – 2012-09-09 (×5): 3.125 mg via ORAL
  Filled 2012-09-07 (×7): qty 1

## 2012-09-07 NOTE — Progress Notes (Signed)
Report was called to RN on unit 3 West. Pt will transfer to room 3 West 23. Wife is at the bedside for transfer.

## 2012-09-07 NOTE — Progress Notes (Signed)
TELEMETRY: Reviewed telemetry pt in NSR, 4 beat run of PVCs.Ceasar Mons Vitals:   09/07/12 0300 09/07/12 0400 09/07/12 0500 09/07/12 0600  BP: 104/62 133/73 139/73 144/79  Pulse: 59 52 58 53  Temp:  98.2 F (36.8 C)    TempSrc:  Oral    Resp: 24 26 11 17   Height:      Weight:   168 lb 11.2 oz (76.522 kg)   SpO2: 96% 96% 97% 95%    Intake/Output Summary (Last 24 hours) at 09/07/12 0744 Last data filed at 09/07/12 0500  Gross per 24 hour  Intake 1599.05 ml  Output   1800 ml  Net -200.95 ml    SUBJECTIVE Feels well. No further chest pain. Denies SOB.  LABS: Basic Metabolic Panel:  Recent Labs  96/04/54 1701 09/07/12 0140  NA 137 138  K 3.7 4.2  CL 105 106  CO2 23 24  GLUCOSE 140* 103*  BUN 14 13  CREATININE 0.89 1.05  CALCIUM 8.7 8.7   Liver Function Tests:  Recent Labs  09/06/12 1147 09/06/12 1701  AST 24 65*  ALT 19 22  ALKPHOS 76 80  BILITOT 0.5 0.4  PROT 6.4 6.1  ALBUMIN 3.4* 3.3*   CBC:  Recent Labs  09/06/12 1701 09/06/12 2127 09/07/12 0140  WBC 7.7  --  10.0  NEUTROABS 6.5  --   --   HGB 13.7  --  13.2  HCT 38.8*  --  37.1*  MCV 89.0  --  89.4  PLT 170 171 174   Cardiac Enzymes:  Recent Labs  09/06/12 1147 09/06/12 1701 09/06/12 1930 09/07/12 0140  CKTOTAL 135  --   --   --   CKMB 6.5*  --   --   --   TROPONINI 0.88* 15.79* 16.91* 18.63*   Hemoglobin A1C:  Recent Labs  09/06/12 1147  HGBA1C 5.8*   Fasting Lipid Panel:  Recent Labs  09/07/12 0140  CHOL 153  HDL 41  LDLCALC 85  TRIG 136  CHOLHDL 3.7   Thyroid Function Tests:  Recent Labs  09/06/12 1701  TSH 1.873   Ecg: NSR, T wave abnormality c/w anterior ischemia  Radiology/Studies:  No results found.  Echo: Study Conclusions  - Left ventricle: The cavity size was normal. Wall thickness was normal. Systolic function was severely reduced. The estimated ejection fraction was in the range of 25% to 30%. Mid to apical anteroseptal and inferoseptal  akinesis, apical lateral akinesis, mid to apical anterior severe hypokinesis. Akinesis of the true apex. Doppler parameters are consistent with abnormal left ventricular relaxation (grade 1 diastolic dysfunction). - Aortic valve: There was no stenosis. - Mitral valve: Trivial regurgitation. - Right ventricle: The cavity size was normal. Systolic function was mildly reduced. - Tricuspid valve: Peak RV-RA gradient: 28mm Hg (S). - Pulmonary arteries: PA peak pressure: 38mm Hg (S). - Systemic veins: IVC not visualized. Impressions:  - Normal LV size with EF 25-30%. Wall motion abnormalities as noted above. Normal RV size with mildly decreased systolic function. Mild pulmonary hypertension.   PHYSICAL EXAM General: Well developed, well nourished, in no acute distress. Head: Normal Neck: Negative for carotid bruits. JVD not elevated. Lungs: Clear bilaterally to auscultation without wheezes, rales, or rhonchi. Breathing is unlabored. Heart: RRR S1 S2 without murmurs, rubs, or gallops.  Abdomen: Soft, non-tender, non-distended with normoactive bowel sounds. No hepatomegaly. No rebound/guarding. No obvious abdominal masses. Msk:  Strength and tone appears normal for age. Extremities: No clubbing, cyanosis or  edema.  Distal pedal pulses are 2+ and equal bilaterally. Extensive bruising right forearm without significant hematoma. Neuro: Alert and oriented X 3. Moves all extremities spontaneously. Psych:  Responds to questions appropriately with a normal affect.  ASSESSMENT AND PLAN: 1. Anterior STEMI. S/p DES of LAD. On ASA and Brilinta. Completed 12 hours of IV Integrelin for heavy thrombus burden. Clinically doing well. Will start low dose beta blocker.  2. Acute systolic CHF secondary to ischemic cardiomyopathy. EF 40% by cath. 25-30% by Echo. On ARB. Add low dose Coreg ( watch for bradycardia) and aldactone. Check Cxr. 3. Hyperlipidemia. On high dose lipitor. 4. HTN  controlled.  Disposition. Will transfer to floor today.  Active Problems:   HYPERLIPIDEMIA   HYPERTENSION   ST elevation myocardial infarction (STEMI) of anterior wall   Acute systolic CHF (congestive heart failure)   Cardiomyopathy, ischemic    Signed, Jarome Trull [place] MD,FACC 09/07/2012 7:44 AM

## 2012-09-07 NOTE — Progress Notes (Signed)
CARDIAC REHAB PHASE I   PRE:  Rate/Rhythm: 91SR  BP:  Supine:   Sitting: 113/67  Standing:    SaO2: 95%RA  MODE:  Ambulation: 550 ft   POST:  Rate/Rhythm: 85SR  BP:  Supine:   Sitting: 116/67  Standing:    SaO2: 99%RA 1405-1516 Pt walked 550 ft with hand held asst. Gait steady. No CP. Tolerated well. Education completed re MI and CHF. Discussed low sodium diet and daily weights with low EF. Gave CHF packet. Discussed CRP 2 and permission given to refer to [place]. Wife present for ed.    Luetta Nutting, RN BSN  09/07/2012 3:12 PM

## 2012-09-08 LAB — BASIC METABOLIC PANEL
CO2: 25 mEq/L (ref 19–32)
Calcium: 9.1 mg/dL (ref 8.4–10.5)
Creatinine, Ser: 1.16 mg/dL (ref 0.50–1.35)
GFR calc non Af Amer: 60 mL/min — ABNORMAL LOW (ref >90)
Glucose, Bld: 89 mg/dL (ref 70–99)
Sodium: 138 mEq/L (ref 135–145)

## 2012-09-08 NOTE — Progress Notes (Signed)
Subjective:  Patient has not had any chest pain.  He tolerated ambulation yesterday with cardiac rehabilitation.  He was started on spironolactone yesterday.  Blood pressure today slightly soft 103/58.  Denies dizziness.  EKG today shows further is having changes of recent anterior wall STEMI.  Significant myocardial infarction by troponin levels.  Troponin still rising when last checked.  Objective:  Vital Signs in the last 24 hours: Temp:  [97.7 F (36.5 C)-98.2 F (36.8 C)] 98.1 F (36.7 C) (09/06 0500) Pulse Rate:  [59-79] 70 (09/06 0500) Resp:  [16-20] 16 (09/06 0500) BP: (103-139)/(58-101) 103/58 mmHg (09/06 0500) SpO2:  [95 %-100 %] 95 % (09/06 0500) Weight:  [169 lb 15.6 oz (77.1 kg)] 169 lb 15.6 oz (77.1 kg) (09/06 0500)  Intake/Output from previous day: 09/05 0701 - 09/06 0700 In: 840 [P.O.:840] Out: -  Intake/Output from this shift: Total I/O In: 240 [P.O.:240] Out: -   . aspirin EC  81 mg Oral Daily  . atorvastatin  80 mg Oral q1800  . carvedilol  3.125 mg Oral BID WC  . losartan  50 mg Oral Daily  . sodium chloride  3 mL Intravenous Q12H  . spironolactone  25 mg Oral Daily  . Ticagrelor  90 mg Oral BID      Physical Exam: The patient appears to be in no distress.  Head and neck exam reveals that the pupils are equal and reactive.  The extraocular movements are full.  There is no scleral icterus.  Mouth and pharynx are benign.  No lymphadenopathy.  No carotid bruits.  The jugular venous pressure is normal.  Thyroid is not enlarged or tender.  Chest is clear to percussion and auscultation.  No rales or rhonchi.  Expansion of the chest is symmetrical.  Heart reveals no abnormal lift or heave.  First and second heart sounds are normal.  There is no murmur gallop rub or click.  The abdomen is soft and nontender.  Bowel sounds are normoactive.  There is no hepatosplenomegaly or mass.  There are no abdominal bruits.  Extremities reveal no phlebitis or edema.   Pedal pulses are good.  There is no cyanosis or clubbing.  The right radial pulse is strong.  Moderate ecchymosis of right forearm.  Neurologic exam is normal strength and no lateralizing weakness.  No sensory deficits.  Integument reveals no rash  Lab Results:  Recent Labs  09/06/12 1701 09/06/12 2127 09/07/12 0140  WBC 7.7  --  10.0  HGB 13.7  --  13.2  PLT 170 171 174    Recent Labs  09/06/12 1701 09/07/12 0140  NA 137 138  K 3.7 4.2  CL 105 106  CO2 23 24  GLUCOSE 140* 103*  BUN 14 13  CREATININE 0.89 1.05    Recent Labs  09/06/12 1930 09/07/12 0140  TROPONINI 16.91* 18.63*   Hepatic Function Panel  Recent Labs  09/06/12 1701  PROT 6.1  ALBUMIN 3.3*  AST 65*  ALT 22  ALKPHOS 80  BILITOT 0.4    Recent Labs  09/07/12 0140  CHOL 153   No results found for this basename: PROTIME,  in the last 72 hours  Imaging: Dg Chest 2 View  09/07/2012   *RADIOLOGY REPORT*  Clinical Data: Post cardiac catheterization for STEMI  CHEST - 2 VIEW  Comparison: None.  Findings: Normal cardiac silhouette.  There is mild tortuosity/unfolding and possible ectasia of the thoracic aorta. No focal airspace opacities.  No pleural effusion or  pneumothorax. No evidence of edema.  No acute osseous abnormalities.  Mild degenerative change of the right glenohumeral joint.  IMPRESSION: 1.  No acute cardiopulmonary disease, specifically, no evidence of edema.  2.  Mild tortuosity and possible slight ectasia of the thoracic aorta.  Correlation with findings from recent cardiac catheterization is recommended.   Original Report Authenticated By: Tacey Ruiz, MD    Cardiac Studies: Telemetry shows normal sinus rhythm with occasional PVCs Assessment/Plan:  1. Anterior STEMI. S/p DES of LAD. On ASA and Brilinta. Completed 12 hours of IV Integrelin for heavy thrombus burden. Clinically doing well.  On low dose beta blocker.  2. Acute systolic CHF secondary to ischemic cardiomyopathy. EF 40%  by cath. 25-30% by Echo. On ARB.  On low dose Coreg ( watch for bradycardia) and aldactone.  Heart size normal. 3. Hyperlipidemia. On high dose lipitor.  4. HTN presently blood pressure is soft.  Plan: We'll get a followup troponin today.  We'll get daily basal metabolic panel starting today to be sure that he does not develop hyperkalemia on spironolactone and ARB. Continue cardiac rehabilitation today and I spoke with him about outpatient rehabilitation. Anticipate home tomorrow if stable.  Recheck EKG in a.m.   LOS: 2 days    Cassell Clement 09/08/2012, 9:13 AM

## 2012-09-08 NOTE — Progress Notes (Signed)
CARDIAC REHAB PHASE I   PRE:  Rate/Rhythm: 77 sinus  BP:  Sitting: 114/72     MODE:  Ambulation: 550 ft   POST:  Rate/Rhythem: 86  BP:  Sitting: 124/66   SaO2: 99 RA  Pt ambulated 550 ft with assist x1.  Pt tolerated walk well without complaints.  Pt had no follow questions from yesterday's education.  Pt returned to chair after walk with call bell in reach.  He was encouraged to continue to walk over weekend. Fabio Pierce, MA, ACSM RCEP [phone]  Jesus Warner

## 2012-09-09 ENCOUNTER — Encounter (HOSPITAL_COMMUNITY): Payer: Self-pay | Admitting: Physician Assistant

## 2012-09-09 DIAGNOSIS — I2510 Atherosclerotic heart disease of native coronary artery without angina pectoris: Secondary | ICD-10-CM

## 2012-09-09 LAB — BASIC METABOLIC PANEL
BUN: 18 mg/dL (ref 6–23)
Calcium: 8.9 mg/dL (ref 8.4–10.5)
Creatinine, Ser: 1.07 mg/dL (ref 0.50–1.35)
GFR calc Af Amer: 76 mL/min — ABNORMAL LOW (ref >90)
GFR calc non Af Amer: 66 mL/min — ABNORMAL LOW (ref >90)
Glucose, Bld: 99 mg/dL (ref 70–99)

## 2012-09-09 MED ORDER — LOSARTAN POTASSIUM 50 MG PO TABS: 50.0000 mg | ORAL_TABLET | Freq: Every day | ORAL | Status: DC

## 2012-09-09 MED ORDER — ATORVASTATIN CALCIUM 80 MG PO TABS: 80.0000 mg | ORAL_TABLET | Freq: Every day | ORAL | Status: DC

## 2012-09-09 MED ORDER — SPIRONOLACTONE 25 MG PO TABS: 25.0000 mg | ORAL_TABLET | Freq: Every day | ORAL | Status: DC

## 2012-09-09 MED ORDER — ASPIRIN 81 MG PO TBEC: 81.0000 mg | DELAYED_RELEASE_TABLET | Freq: Every day | ORAL | Status: DC

## 2012-09-09 MED ORDER — NITROGLYCERIN 0.4 MG SL SUBL: 0.4000 mg | SUBLINGUAL_TABLET | SUBLINGUAL | Status: DC | PRN

## 2012-09-09 MED ORDER — CARVEDILOL 3.125 MG PO TABS: 3.1250 mg | ORAL_TABLET | Freq: Two times a day (BID) | ORAL | Status: DC

## 2012-09-09 MED ORDER — TICAGRELOR 90 MG PO TABS: 90.0000 mg | ORAL_TABLET | Freq: Two times a day (BID) | ORAL | Status: DC

## 2012-09-09 NOTE — Discharge Summary (Signed)
Discharge Summary   Patient ID: Jesus Warner,  MRN: [id number], DOB/AGE: 75-Feb-1937 75 y.o.  Admit date: 09/06/2012 Discharge date: 09/09/2012  Primary Physician: Katy Apo, MD Primary Cardiologist: Ellis Parents- M. Excell Seltzer, MD   Discharge Diagnoses Principal Problem:   ST elevation myocardial infarction (STEMI) of anterior wall  - S/p cardiac catheterization 09/06/12- total mid LAD occlusion s/p DES, 40% distal LAD, 80% ostial LCx (small vessel supplying single OM branch), widely patent RCA; akinesis of distal anterior, apical and inferoapical walls; EF 40%  - DAPT- ASA/Brilinta x 12 months Active Problems:   CAD (coronary artery disease), native coronary artery  - As above, discharged on ASA/Brilinta/ARB/BB/statin/NTG SL PRN   Cardiomyopathy, ischemic  - EF 40% on cath  - Follow-up 2D echo: EF 25-30%, mid to apical anteroseptal and inferoseptal akinesis, apical lateral akinesis, mid apical anterior severe hypokinesis. Akinesis of the true apex. Doppler parameters are consistent with abnormal left ventricular relaxation (grade 1 diastolic dysfunction). Mildly decreased RV systolic function, normal size, PASP 38 mmHg.  - Spironolactone added to ARB and BB therapy  - Remained euvolemic through admission   HYPERLIPIDEMIA  - Atorvastatin increased to 80mg  daily  - Check LFTs, lipid panel in 6 weeks   HYPERTENSION  - Well-controlled on above antihypertensives  Allergies No Known Allergies  Diagnostic Studies/Procedures  CARDIAC CATHETERIZATION + PERCUTANEOUS CORONARY INTERVENTION - 09/06/12  Hemodynamics:  AO 95/64  LV 96/14   Coronary angiography:  Coronary dominance: right  Left mainstem: Arises from the left cusp, divides into the LAD and LCx. No significant stenosis.  Left anterior descending (LAD): Large vessel. Total occlusion at the origin of the 1st septal perforator and first diagonal. After reperfusion, the LAD is visualized and it wraps around the LV apex. There is  discrete 40% distal LAD stenosis.  Left circumflex (LCx): small vessel. 80% ostial stenosis. Supplies only a single OM branch.  Right coronary artery (RCA): Large, dominant vessel. Widely patent throughout. Minimal irregularity in the mid-RCA is present. The PDA and PLA branches are patent without significant disease.  Left ventriculography: Left ventricular systolic function is moderately reduced with akinesis of the distal anterior, apical, and inferoapical walls. The LVEF is estimated at 40%.   PCI Data:  Vessel - LAD/Segment - mid  Percent Stenosis (pre) 100  TIMI-flow 0  Stent 3.0x18 mm Xience DES  Percent Stenosis (post) 0  TIMI-flow (post) 3   Final Conclusions:  1. Total LAD occlusion treated successfully with primary PCI using a DES platform  2. Moderate ostial LCx stenosis  3. Widely patent RCA  4. Moderate LV dysfunction consistent with anterior MI   TRANSTHORACIC ECHOCARDIOGRAM - 09/06/12  Study Conclusions  - Left ventricle: The cavity size was normal. Wall thickness was normal. Systolic function was severely reduced. The estimated ejection fraction was in the range of 25% to 30%. Mid to apical anteroseptal and inferoseptal akinesis, apical lateral akinesis, mid to apical anterior severe hypokinesis. Akinesis of the true apex. Doppler parameters are consistent with abnormal left ventricular relaxation (grade 1 diastolic dysfunction).  - Aortic valve: There was no stenosis. - Mitral valve: Trivial regurgitation. - Right ventricle: The cavity size was normal. Systolic function was mildly reduced.  - Tricuspid valve: Peak RV-RA gradient: 28mm Hg (S). - Pulmonary arteries: PA peak pressure: 38mm Hg (S). - Systemic veins: IVC not visualized.  PA/LATERAL CHEST X-RAY - 09/07/12  IMPRESSION:  1. No acute cardiopulmonary disease, specifically, no evidence of edema.  2. Mild tortuosity and possible slight  ectasia of the thoracic aorta. Correlation with findings from recent cardiac  catheterization is recommended.  History of Present Illness  Jesus Warner is a 77 y.o. male who was admitted to [facility] hospital 9/4 to 9/7 with the above problem list.   He has no prior cardiac history. He had been in his USOH until the evening prior to admission when he endorsed substernal chest pain w/o associated symptoms while at church. Duration was unclear, however the discomfort eventually resolved. The following morning after eating breakfast, he experienced severe substernal chest pressure w/ associated diaphoresis. He drove to his PCP's office where EKG indicated anterior ST elevations. He was given a full dose ASA and NTG. He become hypotensive due to the NTG. Code STEMI was called, and he arrived at the [facility] cath lab.   Hospital Course   The full angiographic and PCI data is outlined above. Notably, this revealed an occluded mid LAD lesion. He was loaded with Brilinta and a DES was successfully placed to the lesion. The patient's EF was noted to be 40% with several WMAs. He did tolerate the procedure well without complications. The recommendation was made to continue DAPT- ASA/Brilinta x 12 months. He was transferred to CCU in good condition.  Serial troponins elevated correspondingly as noted below. Hgb A1C < 6.5%, TSH WNL. Lipid panel indicated fair lipid control. He underwent a post-cath echocardiogram as above which indicated an EF of 25-30% and multiple WMAs. RV systolic function was mildly reduced. Mild pulmonary HTN was appreciated. His outpatient Losartan was reduced to 50mg  due to labile BPs. BP improved, and he was started on BB and spironolactone in addition to ARB therapy. Atorvastatin was increased to 80mg  daily. He remained stable throughout the admission and ambulated well with cardiac rehab. There were no appreciable arrhythmias on telemetry. He was evaluated by Dr. Patty Sermons today and deemed to be stable for discharge.   Given the patient's newly diagnosed  ischemic cardiomyopathy and EF <35%, the topic of temporary LifeVest placement prior to follow-up echo in ~40 days after reperfusion and optimized medical therapy was discussed with Dr. Johney Frame. He felt the patient would be an appropriate candidate, but that the data for benefit of prophylactic LifeVest therapy post-MI with reduced EF is unavailable. The reasons against placement can be referenced from he DYNAMIT trial regarding no benefit and trend towards harm with the use of prophylactic ICD placement post-MI. The decision was ultimately left for the patient and his wife who preferred LifeVest placement. This will be arranged.   He will be discharged on the medication regimen outlined below. He will follow-up in 7 days as part of transitional care management. BMET will be checked at this tim given the addition of spironolactone. LFTs and lipid panel will need to be checked in 6 weeks given up-titration of statin therapy. This information, including supplemental post-STEMI instructions, activity restrictions, post-cath wound care and secondary prevention strategies, has been clearly outlined in the discharge AVS.   Discharge Vitals:  Blood pressure 101/65, pulse 70, temperature 98.3 F (36.8 C), temperature source Oral, resp. rate 18, height 5\' 10"  (1.778 m), weight 76.658 kg (169 lb), SpO2 100.00%.   Weight change: -0.442 kg (-15.6 oz)  Labs: Recent Labs     09/06/12  1701  09/06/12  2127  09/07/12  0140  WBC  7.7   --   10.0  HGB  13.7   --   13.2  HCT  38.8*   --   37.1*  MCV  89.0   --   89.4  PLT  170  171  174   Recent Labs Lab 09/06/12 1701 09/07/12 0140 09/08/12 0945 09/09/12 0440  NA 137 138 138 136  K 3.7 4.2 3.8 4.0  CL 105 106 104 104  CO2 23 24 25 22   BUN 14 13 18 18   CREATININE 0.89 1.05 1.16 1.07  CALCIUM 8.7 8.7 9.1 8.9  PROT 6.1  --   --   --   BILITOT 0.4  --   --   --   ALKPHOS 80  --   --   --   ALT 22  --   --   --   AST 65*  --   --   --   GLUCOSE 140*  103* 89 99   Recent Labs     09/06/12  1147  HGBA1C  5.8*   Recent Labs     09/06/12  1147   09/06/12  1930  09/07/12  0140  09/08/12  0945  CKTOTAL  135   --    --    --    --   CKMB  6.5*   --    --    --    --   TROPONINI  0.88*   < >  16.91*  18.63*  8.26*   < > = values in this interval not displayed.   Recent Labs     09/07/12  0140  CHOL  153  HDL  41  LDLCALC  85  TRIG  136  CHOLHDL  3.7    Recent Labs  09/06/12 1701  TSH 1.873    Disposition:  Discharge Orders   Future Orders Complete By Expires   Amb Referral to Cardiac Rehabilitation  As directed    Diet - low sodium heart healthy  As directed    Increase activity slowly  As directed          Follow-up Information   Follow up with [facility]. (Office will call with an appointment date & time in 1 week. )    Contact information:   [address] [place] [address]       Follow up with Katy Apo, MD. Schedule an appointment as soon as possible for a visit in 1 week. (For general post-hospital follow-up. )    Specialty:  Internal Medicine   Contact information:   [address] [place] [address] [phone]       Discharge Medications:    Medication List    STOP taking these medications       ADVIL MIGRAINE PO      TAKE these medications       aspirin 81 MG EC tablet  Take 1 tablet (81 mg total) by mouth daily.     atorvastatin 80 MG tablet  Commonly known as:  LIPITOR  Take 1 tablet (80 mg total) by mouth daily at 6 PM.     carvedilol 3.125 MG tablet  Commonly known as:  COREG  Take 1 tablet (3.125 mg total) by mouth 2 (two) times daily with a meal.     losartan 50 MG tablet  Commonly known as:  COZAAR  Take 1 tablet (50 mg total) by mouth daily.     nitroGLYCERIN 0.4 MG SL tablet  Commonly known as:  NITROSTAT  Place 1 tablet (0.4 mg total) under the tongue every 5 (five) minutes x 3 doses as  needed for chest pain.      spironolactone 25 MG tablet  Commonly known as:  ALDACTONE  Take 1 tablet (25 mg total) by mouth daily.     Ticagrelor 90 MG Tabs tablet  Commonly known as:  BRILINTA  Take 1 tablet (90 mg total) by mouth 2 (two) times daily.       Outstanding Labs/Studies: BMET in 1 weeks, LFTs and lipid panel in 6 weeks  Duration of Discharge Encounter: Greater than 30 minutes including physician time.  Signed, R. Hurman Horn, PA-C 09/09/2012, 10:40 AM

## 2012-09-09 NOTE — Progress Notes (Signed)
Subjective:  Patient has not had any chest pain.  He is tolerating ambulation.  Followup electrolytes are stable on spironolactone and ARB.  EKG shows stable evidence of recent anterior wall myocardial infarction.  Blood pressure remains soft.  No dizziness or syncope.  Telemetry is stable normal sinus rhythm  Objective:  Vital Signs in the last 24 hours: Temp:  [97.3 F (36.3 C)-98.3 F (36.8 C)] 98.3 F (36.8 C) (09/07 0500) Pulse Rate:  [69-91] 70 (09/07 0500) Resp:  [17-23] 18 (09/07 0500) BP: (99-128)/(59-68) 101/65 mmHg (09/07 0500) SpO2:  [97 %-100 %] 100 % (09/07 0500) Weight:  [169 lb (76.658 kg)] 169 lb (76.658 kg) (09/07 0500)  Intake/Output from previous day: 09/06 0701 - 09/07 0700 In: 776 [P.O.:720; I.V.:6] Out: -  Intake/Output from this shift:    . aspirin EC  81 mg Oral Daily  . atorvastatin  80 mg Oral q1800  . carvedilol  3.125 mg Oral BID WC  . losartan  50 mg Oral Daily  . sodium chloride  3 mL Intravenous Q12H  . spironolactone  25 mg Oral Daily  . Ticagrelor  90 mg Oral BID      Physical Exam: The patient appears to be in no distress.  Head and neck exam reveals that the pupils are equal and reactive.  The extraocular movements are full.  There is no scleral icterus.  Mouth and pharynx are benign.  No lymphadenopathy.  No carotid bruits.  The jugular venous pressure is normal.  Thyroid is not enlarged or tender.  Chest is clear to percussion and auscultation.  No rales or rhonchi.  Expansion of the chest is symmetrical.  Heart reveals no abnormal lift or heave.  First and second heart sounds are normal.  There is no murmur gallop rub or click.  The abdomen is soft and nontender.  Bowel sounds are normoactive.  There is no hepatosplenomegaly or mass.  There are no abdominal bruits.  Extremities reveal no phlebitis or edema.  Pedal pulses are good.  There is no cyanosis or clubbing.  The right radial pulse is strong.  Moderate ecchymosis of right  forearm.  Neurologic exam is normal strength and no lateralizing weakness.  No sensory deficits.  Integument reveals no rash  Lab Results:  Recent Labs  09/06/12 1701 09/06/12 2127 09/07/12 0140  WBC 7.7  --  10.0  HGB 13.7  --  13.2  PLT 170 171 174    Recent Labs  09/08/12 0945 09/09/12 0440  NA 138 136  K 3.8 4.0  CL 104 104  CO2 25 22  GLUCOSE 89 99  BUN 18 18  CREATININE 1.16 1.07    Recent Labs  09/07/12 0140 09/08/12 0945  TROPONINI 18.63* 8.26*   Hepatic Function Panel  Recent Labs  09/06/12 1701  PROT 6.1  ALBUMIN 3.3*  AST 65*  ALT 22  ALKPHOS 80  BILITOT 0.4    Recent Labs  09/07/12 0140  CHOL 153   No results found for this basename: PROTIME,  in the last 72 hours  Imaging: Dg Chest 2 View  09/07/2012   *RADIOLOGY REPORT*  Clinical Data: Post cardiac catheterization for STEMI  CHEST - 2 VIEW  Comparison: None.  Findings: Normal cardiac silhouette.  There is mild tortuosity/unfolding and possible ectasia of the thoracic aorta. No focal airspace opacities.  No pleural effusion or pneumothorax. No evidence of edema.  No acute osseous abnormalities.  Mild degenerative change of the right glenohumeral joint.  IMPRESSION: 1.  No acute cardiopulmonary disease, specifically, no evidence of edema.  2.  Mild tortuosity and possible slight ectasia of the thoracic aorta.  Correlation with findings from recent cardiac catheterization is recommended.   Original Report Authenticated By: Tacey Ruiz, MD    Cardiac Studies: Telemetry shows normal sinus rhythm with occasional PVCs Assessment/Plan:  1. Anterior STEMI. S/p DES of LAD. On ASA and Brilinta. Completed 12 hours of IV Integrelin for heavy thrombus burden. Clinically doing well.  On low dose beta blocker.  2. Acute systolic CHF secondary to ischemic cardiomyopathy. EF 40% by cath. 25-30% by Echo. On ARB.  On beta blocker. 3. Hyperlipidemia. On high dose lipitor.  4. HTN presently blood pressure  is soft but acceptable.  Continue ARB.  Plan: Okay for discharge today.  Continue current medication.  See in the office in one week for transition of care.  The patient will also be entering the cardiac rehabilitation program.   LOS: 3 days    Cassell Clement 09/09/2012, 8:25 AM

## 2012-09-26 ENCOUNTER — Encounter: Payer: Self-pay | Admitting: Physician Assistant

## 2012-09-26 ENCOUNTER — Ambulatory Visit (INDEPENDENT_AMBULATORY_CARE_PROVIDER_SITE_OTHER): Payer: Medicare Other | Admitting: Physician Assistant

## 2012-09-26 ENCOUNTER — Telehealth: Payer: Self-pay | Admitting: *Deleted

## 2012-09-26 VITALS — BP 111/76 | HR 57 | Ht 70.0 in | Wt 165.2 lb

## 2012-09-26 DIAGNOSIS — E785 Hyperlipidemia, unspecified: Secondary | ICD-10-CM

## 2012-09-26 DIAGNOSIS — I2510 Atherosclerotic heart disease of native coronary artery without angina pectoris: Secondary | ICD-10-CM

## 2012-09-26 DIAGNOSIS — I5022 Chronic systolic (congestive) heart failure: Secondary | ICD-10-CM

## 2012-09-26 DIAGNOSIS — I2589 Other forms of chronic ischemic heart disease: Secondary | ICD-10-CM

## 2012-09-26 DIAGNOSIS — I10 Essential (primary) hypertension: Secondary | ICD-10-CM

## 2012-09-26 LAB — BASIC METABOLIC PANEL
BUN: 23 mg/dL (ref 6–23)
Calcium: 9.2 mg/dL (ref 8.4–10.5)
Chloride: 102 mEq/L (ref 96–112)
Creatinine, Ser: 1.4 mg/dL (ref 0.4–1.5)
GFR: 54.21 mL/min — ABNORMAL LOW (ref >60.00)

## 2012-09-26 NOTE — Progress Notes (Signed)
[address], [address] [place], [place]  [address] Phone: [phone] Fax:  [phone]  Date:  09/26/2012   ID:  Jesus Warner, DOB [birth year], MRN [id number]  PCP:  Katy Apo, MD  Cardiologist:  Dr. Tonny Bollman     History of Present Illness: Jesus Warner is a 77 y.o. male who returns for follow up after a recent admission to the hospital for an anterior STEMI.  He was admitted to the hospital 9/4-9/7. He initially presented to his PCPs office with chest discomfort and ECG demonstrated anterior ST elevation. Emergent LHC 09/06/12: LAD occluded, after reperfusion distal LAD 40%, oCFX 80%, RCA with minimal irregularities, EF 40% with distal anterior, apical and infra-apical AK. PCI: Xience (3 x 18 mm) DES to the mid LAD. CFX was a small vessel and medical therapy was planned.  Echocardiogram 09/06/12: EF 25-30%, mid to apical anteroseptal and inferoseptal AK, apical lateral AK, mid to apical anterior severe HK, true apex AK, grade 1 diastolic dysfunction, trivial MR, mildly reduced RVSF, PASP 38.  Patient was considered for life vest placement and ultimately discharged on a LifeVest.    Since d/c, he has done well.  The patient denies chest pain, shortness of breath, syncope, orthopnea, PND or significant pedal edema. He is eager to remove the LifeVest.  He is actually not wearing it today.   Labs (9/14):  K 4, Cr 1.07, ALT 22, HDL 41, LDL 85, Hgb 13.2, TSH 1.873  Wt Readings from Last 3 Encounters:  09/26/12 165 lb 3.2 oz (74.934 kg)  09/09/12 169 lb (76.658 kg)  09/09/12 169 lb (76.658 kg)     Past Medical History  Diagnosis Date  . Hyperlipidemia   . HTN (hypertension)   . Seasonal allergies   . BPH (benign prostatic hyperplasia)   . Ischemic cardiomyopathy     Echocardiogram 09/06/12: EF 25-30%, mid to apical anteroseptal and inferoseptal AK, apical lateral AK, mid to apical anterior severe HK, true apex AK, grade 1 diastolic dysfunction, trivial MR, mildly  reduced RVSF, PASP 38  . CAD (coronary artery disease)     a. Anterior STEMI (9/14) => s/p DES-mid LAD (LHC 09/06/12: LAD occluded, after reperfusion distal LAD 40%, oCFX 80%, RCA with minimal irregularities, EF 40% with distal anterior, apical and infra-apical AK. PCI: Xience (3 x 18 mm) DES to the mid LAD. CFX was a small vessel and medical therapy was planned)  . Chronic systolic CHF (congestive heart failure)     Current Outpatient Prescriptions  Medication Sig Dispense Refill  . aspirin EC 81 MG EC tablet Take 1 tablet (81 mg total) by mouth daily.      Marland Kitchen atorvastatin (LIPITOR) 80 MG tablet Take 1 tablet (80 mg total) by mouth daily at 6 PM.  30 tablet  3  . carvedilol (COREG) 3.125 MG tablet Take 1 tablet (3.125 mg total) by mouth 2 (two) times daily with a meal.  60 tablet  3  . losartan (COZAAR) 50 MG tablet Take 1 tablet (50 mg total) by mouth daily.  30 tablet  3  . nitroGLYCERIN (NITROSTAT) 0.4 MG SL tablet Place 1 tablet (0.4 mg total) under the tongue every 5 (five) minutes x 3 doses as needed for chest pain.  25 tablet  3  . spironolactone (ALDACTONE) 25 MG tablet Take 1 tablet (25 mg total) by mouth daily.  30 tablet  3  . Ticagrelor (BRILINTA) 90 MG TABS tablet Take 1 tablet (90 mg total)  by mouth 2 (two) times daily.  60 tablet  3   No current facility-administered medications for this visit.    Allergies:   No Known Allergies  Social History:  The patient  reports that he has never smoked. He does not have any smokeless tobacco history on file. He reports that he does not drink alcohol or use illicit drugs.   ROS:  Please see the history of present illness.      All other systems reviewed and negative.   PHYSICAL EXAM: VS:  BP 111/76  Pulse 57  Ht 5\' 10"  (1.778 m)  Wt 165 lb 3.2 oz (74.934 kg)  BMI 23.7 kg/m2 Well nourished, well developed, in no acute distress HEENT: normal Neck: no JVD Cardiac:  normal S1, S2; RRR; no murmur Lungs:  clear to auscultation  bilaterally, no wheezing, rhonchi or rales Abd: soft, nontender, no hepatomegaly Ext: no edema; right wrist without hematoma or mass  Skin: warm and dry Neuro:  CNs 2-12 intact, no focal abnormalities noted  EKG:  NSR, HR 53, PRWP, AL TWI (evolving ant MI)     ASSESSMENT AND PLAN:  1. CAD:  Doing well since recent PCI with a DES to the LAD in the setting of an anterior STEMI.  We discussed the importance of dual antiplatelet therapy.  Refer to cardiac rehab.  Continue statin. 2. Ischemic CM:  EF was 25-30% by echo post PCI.  Patient was given the option of being d/c on a LifeVest.  According to the d/c notes, the benefit of wearing a LifeVest was not clear.  Mr. Raether ultimately decided to be d/c on it.  However, now he would like to d/c it.  I reviewed his case with Dr. Tonny Bollman.  He had a good PCI result and overall should have good return of LVF.  We discussed the very small risk of life threatening arrhythmia.  He will decide whether or not he wants to continue wearing the LifeVest for now.  Plan on repeat Echo 90 days post revascularization.   3. Chronic Systolic CHF:  He is NYHA Class II.  No signs of volume overload.  Continue beta blocker, ACE, spironolactone.  Check BMET today.   4. Hyperlipidemia:  Continue statin.  Check Lipids and LFTs in 4 weeks.  5. Hypertension: Controlled.  Continue current therapy.  6. Disposition:  F/u with Dr. Tonny Bollman in 3 mos after his echo.    Signed, Tereso Newcomer, PA-C  09/26/2012 12:05 PM

## 2012-09-26 NOTE — Patient Instructions (Addendum)
LAB TODAY; BMET  YOU WILL NEED FASTING LIPID AND LIVER PANEL TO BE DONE 10/26/12; LAB OPENS AT 7:30 AM  YOU NEED TO SCHEDULE FOR AN ECHO W/O TO BE DONE AFTER 12/06/12 WHICH WILL BE 90 DAYS OUT FROM PROCEDURE  YOU WILL NEED TO FOLLOW UP WITH DR. Excell Seltzer 1 WEEK AFTER YOU HAVE THE ECHO  You have been referred to CARDIAC REHAB; DX 414.01, 414.8, 428.22, 272.4

## 2012-09-26 NOTE — Telephone Encounter (Signed)
Pt notified about lab results. Repeat bmet 10/05/12. Pt verbalized understanding to Plan of Care

## 2012-10-05 ENCOUNTER — Other Ambulatory Visit (INDEPENDENT_AMBULATORY_CARE_PROVIDER_SITE_OTHER): Payer: Medicare Other

## 2012-10-05 DIAGNOSIS — I10 Essential (primary) hypertension: Secondary | ICD-10-CM

## 2012-10-05 DIAGNOSIS — E785 Hyperlipidemia, unspecified: Secondary | ICD-10-CM

## 2012-10-05 LAB — LIPID PANEL
Cholesterol: 131 mg/dL (ref 0–200)
LDL Cholesterol: 58 mg/dL (ref 0–99)
Triglycerides: 177 mg/dL — ABNORMAL HIGH (ref 0.0–149.0)

## 2012-10-05 LAB — BASIC METABOLIC PANEL
BUN: 19 mg/dL (ref 6–23)
Calcium: 9.1 mg/dL (ref 8.4–10.5)
Chloride: 104 mEq/L (ref 96–112)
Creatinine, Ser: 1.4 mg/dL (ref 0.4–1.5)

## 2012-10-05 LAB — HEPATIC FUNCTION PANEL
AST: 21 U/L (ref 0–37)
Alkaline Phosphatase: 95 U/L (ref 39–117)
Bilirubin, Direct: 0 mg/dL (ref 0.0–0.3)
Total Protein: 6.9 g/dL (ref 6.0–8.3)

## 2012-10-18 ENCOUNTER — Encounter (HOSPITAL_COMMUNITY)
Admission: RE | Admit: 2012-10-18 | Discharge: 2012-10-18 | Disposition: A | Discharge status: Home / Self Care | Payer: Medicare Other | Source: Ambulatory Visit | Attending: Cardiovascular Disease | Admitting: Cardiovascular Disease

## 2012-10-18 DIAGNOSIS — I2589 Other forms of chronic ischemic heart disease: Secondary | ICD-10-CM | POA: Insufficient documentation

## 2012-10-18 DIAGNOSIS — Z5189 Encounter for other specified aftercare: Secondary | ICD-10-CM | POA: Insufficient documentation

## 2012-10-18 DIAGNOSIS — I2109 ST elevation (STEMI) myocardial infarction involving other coronary artery of anterior wall: Secondary | ICD-10-CM | POA: Insufficient documentation

## 2012-10-18 DIAGNOSIS — Z9861 Coronary angioplasty status: Secondary | ICD-10-CM | POA: Insufficient documentation

## 2012-10-18 NOTE — Progress Notes (Signed)
Cardiac Rehab Medication Review by a Pharmacist  Does the patient  feel that his/her medications are working for him/her? YES  Has the patient been experiencing any side effects to the medications prescribed?  NO  Does the patient measure his/her own blood pressure or blood glucose at home?  YES, every morning  Does the patient have any problems obtaining medications due to transportation or finances?  NO  Understanding of regimen: good    Understanding of indications: good Potential of compliance: good    Pharmacist comments: Pt states he does not take any OTC or herbals/supplements since his heart attack in September.  He also reports walking everyday since his heart attack.    Anabel Bene, PharmD Clinical Pharmacist Pager: [phone]  Anabel Bene 10/18/2012 8:23 AM

## 2012-10-22 ENCOUNTER — Encounter (HOSPITAL_COMMUNITY)
Admission: RE | Admit: 2012-10-22 | Discharge: 2012-10-22 | Disposition: A | Discharge status: Home / Self Care | Payer: Medicare Other | Source: Ambulatory Visit | Attending: Cardiovascular Disease | Admitting: Cardiovascular Disease

## 2012-10-22 NOTE — Progress Notes (Signed)
Pt started cardiac rehab today.  Pt tolerated light exercise without difficulty. Telemetry rhythm sinus with occasional PVC. Vital signs stable. Epic is not wearing the life vest anymore. Will continue to monitor the patient throughout  the program.

## 2012-10-24 ENCOUNTER — Encounter (HOSPITAL_COMMUNITY): Payer: Medicare Other

## 2012-10-24 ENCOUNTER — Telehealth (HOSPITAL_COMMUNITY): Payer: Self-pay | Admitting: Internal Medicine

## 2012-10-26 ENCOUNTER — Encounter (HOSPITAL_COMMUNITY): Payer: Medicare Other

## 2012-10-26 ENCOUNTER — Telehealth (HOSPITAL_COMMUNITY): Payer: Self-pay | Admitting: Internal Medicine

## 2012-10-26 ENCOUNTER — Other Ambulatory Visit: Payer: Medicare Other

## 2012-10-29 ENCOUNTER — Encounter (HOSPITAL_COMMUNITY)
Admission: RE | Admit: 2012-10-29 | Discharge: 2012-10-29 | Disposition: A | Discharge status: Home / Self Care | Payer: Medicare Other | Source: Ambulatory Visit | Attending: Cardiovascular Disease | Admitting: Cardiovascular Disease

## 2012-10-31 ENCOUNTER — Encounter (HOSPITAL_COMMUNITY)
Admission: RE | Admit: 2012-10-31 | Discharge: 2012-10-31 | Disposition: A | Discharge status: Home / Self Care | Payer: Medicare Other | Source: Ambulatory Visit | Attending: Cardiovascular Disease | Admitting: Cardiovascular Disease

## 2012-11-01 ENCOUNTER — Other Ambulatory Visit: Payer: Medicare Other

## 2012-11-02 ENCOUNTER — Encounter (HOSPITAL_COMMUNITY)
Admission: RE | Admit: 2012-11-02 | Discharge: 2012-11-02 | Disposition: A | Discharge status: Home / Self Care | Payer: Medicare Other | Source: Ambulatory Visit | Attending: Cardiovascular Disease | Admitting: Cardiovascular Disease

## 2012-11-05 ENCOUNTER — Encounter (HOSPITAL_COMMUNITY)
Admission: RE | Admit: 2012-11-05 | Discharge: 2012-11-05 | Disposition: A | Discharge status: Home / Self Care | Payer: Medicare Other | Source: Ambulatory Visit | Attending: Cardiovascular Disease | Admitting: Cardiovascular Disease

## 2012-11-05 DIAGNOSIS — Z9861 Coronary angioplasty status: Secondary | ICD-10-CM | POA: Insufficient documentation

## 2012-11-05 DIAGNOSIS — I2109 ST elevation (STEMI) myocardial infarction involving other coronary artery of anterior wall: Secondary | ICD-10-CM | POA: Insufficient documentation

## 2012-11-05 DIAGNOSIS — I2589 Other forms of chronic ischemic heart disease: Secondary | ICD-10-CM | POA: Insufficient documentation

## 2012-11-05 DIAGNOSIS — Z5189 Encounter for other specified aftercare: Secondary | ICD-10-CM | POA: Insufficient documentation

## 2012-11-07 ENCOUNTER — Encounter (HOSPITAL_COMMUNITY)
Admission: RE | Admit: 2012-11-07 | Discharge: 2012-11-07 | Disposition: A | Discharge status: Home / Self Care | Payer: Medicare Other | Source: Ambulatory Visit | Attending: Cardiovascular Disease | Admitting: Cardiovascular Disease

## 2012-11-07 NOTE — Progress Notes (Signed)
Reviewed home exercise with pt today.  Pt plans to continue walking at home for exercise.  Reviewed THR, pulse (needs to continue to practice), RPE, sign and symptoms, NTG use, and when to call 911 or MD.  Pt voiced understanding. Fabio Pierce, MA, ACSM RCEP

## 2012-11-09 ENCOUNTER — Encounter (HOSPITAL_COMMUNITY): Payer: Medicare Other

## 2012-11-12 ENCOUNTER — Encounter (HOSPITAL_COMMUNITY)
Admission: RE | Admit: 2012-11-12 | Discharge: 2012-11-12 | Disposition: A | Discharge status: Home / Self Care | Payer: Medicare Other | Source: Ambulatory Visit | Attending: Cardiovascular Disease | Admitting: Cardiovascular Disease

## 2012-11-14 ENCOUNTER — Encounter (HOSPITAL_COMMUNITY)
Admission: RE | Admit: 2012-11-14 | Discharge: 2012-11-14 | Disposition: A | Discharge status: Home / Self Care | Payer: Medicare Other | Source: Ambulatory Visit | Attending: Cardiovascular Disease | Admitting: Cardiovascular Disease

## 2012-11-16 ENCOUNTER — Encounter (HOSPITAL_COMMUNITY)
Admission: RE | Admit: 2012-11-16 | Discharge: 2012-11-16 | Disposition: A | Discharge status: Home / Self Care | Payer: Medicare Other | Source: Ambulatory Visit | Attending: Cardiovascular Disease | Admitting: Cardiovascular Disease

## 2012-11-19 ENCOUNTER — Encounter (HOSPITAL_COMMUNITY)
Admission: RE | Admit: 2012-11-19 | Discharge: 2012-11-19 | Disposition: A | Discharge status: Home / Self Care | Payer: Medicare Other | Source: Ambulatory Visit | Attending: Cardiovascular Disease | Admitting: Cardiovascular Disease

## 2012-11-19 NOTE — Progress Notes (Signed)
Jesus Warner 77 y.o. male Nutrition Note Spoke with pt.  Nutrition Plan and Nutrition Survey goals reviewed with pt. Pt is following Step 2 of the Therapeutic Lifestyle Changes diet. Pt has CHF. Pt watching his sodium intake by "using Dash to season foods and eating mostly fresh vegetables." Pt is pre-diabetic according to pt's most recent A1c. Pre-diabetes discussed. Pt reports family h/o DM in his mom. Pt encouraged to discuss pre-diabetes with his PCP. Pt expressed understanding of the information reviewed. Pt aware of nutrition education classes offered.  Nutrition Diagnosis   Food-and nutrition-related knowledge deficit related to lack of exposure to information as related to diagnosis of: ? CVD   Nutrition RX/ Estimated Daily Nutrition Needs for: wt maintenance 2100-2400 Kcal, 65-75 gm fat, 14-16 gm sat fat, 2.1-2.4 gm trans-fat, <1500 mg sodium  Nutrition Intervention   Pt's individual nutrition plan reviewed with pt.   Benefits of adopting Therapeutic Lifestyle Changes discussed when Medficts reviewed.   Pt to attend the Portion Distortion class   Pt to attend the  ? Nutrition I class                     ? Nutrition II class - met 11/13/12   Continue client-centered nutrition education by RD, as part of interdisciplinary care. Goal(s)   Pt to describe the benefit of including fruits, vegetables, whole grains, and low-fat dairy products in a heart healthy meal plan. Monitor and Evaluate progress toward nutrition goal with team. Nutrition Risk: Change to Moderate Jesus Warner, M.Ed, RD, LDN, CDE 11/19/2012 10:42 AM

## 2012-11-21 ENCOUNTER — Encounter (HOSPITAL_COMMUNITY)
Admission: RE | Admit: 2012-11-21 | Discharge: 2012-11-21 | Disposition: A | Discharge status: Home / Self Care | Payer: Medicare Other | Source: Ambulatory Visit | Attending: Cardiovascular Disease | Admitting: Cardiovascular Disease

## 2012-11-23 ENCOUNTER — Encounter (HOSPITAL_COMMUNITY)
Admission: RE | Admit: 2012-11-23 | Discharge: 2012-11-23 | Disposition: A | Discharge status: Home / Self Care | Payer: Medicare Other | Source: Ambulatory Visit | Attending: Cardiovascular Disease | Admitting: Cardiovascular Disease

## 2012-11-26 ENCOUNTER — Encounter (HOSPITAL_COMMUNITY)
Admission: RE | Admit: 2012-11-26 | Discharge: 2012-11-26 | Disposition: A | Discharge status: Home / Self Care | Payer: Medicare Other | Source: Ambulatory Visit | Attending: Cardiovascular Disease | Admitting: Cardiovascular Disease

## 2012-11-28 ENCOUNTER — Encounter (HOSPITAL_COMMUNITY)
Admission: RE | Admit: 2012-11-28 | Discharge: 2012-11-28 | Disposition: A | Discharge status: Home / Self Care | Payer: Medicare Other | Source: Ambulatory Visit | Attending: Cardiovascular Disease | Admitting: Cardiovascular Disease

## 2012-12-03 ENCOUNTER — Encounter (HOSPITAL_COMMUNITY)
Admission: RE | Admit: 2012-12-03 | Discharge: 2012-12-03 | Disposition: A | Discharge status: Home / Self Care | Payer: Medicare Other | Source: Ambulatory Visit | Attending: Cardiovascular Disease | Admitting: Cardiovascular Disease

## 2012-12-03 DIAGNOSIS — I2589 Other forms of chronic ischemic heart disease: Secondary | ICD-10-CM | POA: Insufficient documentation

## 2012-12-03 DIAGNOSIS — Z5189 Encounter for other specified aftercare: Secondary | ICD-10-CM | POA: Insufficient documentation

## 2012-12-03 DIAGNOSIS — I2109 ST elevation (STEMI) myocardial infarction involving other coronary artery of anterior wall: Secondary | ICD-10-CM | POA: Insufficient documentation

## 2012-12-03 DIAGNOSIS — Z9861 Coronary angioplasty status: Secondary | ICD-10-CM | POA: Insufficient documentation

## 2012-12-05 ENCOUNTER — Encounter (HOSPITAL_COMMUNITY)
Admission: RE | Admit: 2012-12-05 | Discharge: 2012-12-05 | Disposition: A | Discharge status: Home / Self Care | Payer: Medicare Other | Source: Ambulatory Visit | Attending: Cardiovascular Disease | Admitting: Cardiovascular Disease

## 2012-12-07 ENCOUNTER — Other Ambulatory Visit (HOSPITAL_COMMUNITY): Payer: Self-pay | Admitting: Physician Assistant

## 2012-12-07 ENCOUNTER — Encounter (HOSPITAL_COMMUNITY)
Admission: RE | Admit: 2012-12-07 | Discharge: 2012-12-07 | Disposition: A | Discharge status: Home / Self Care | Payer: Medicare Other | Source: Ambulatory Visit | Attending: Cardiovascular Disease | Admitting: Cardiovascular Disease

## 2012-12-10 ENCOUNTER — Encounter (HOSPITAL_COMMUNITY)
Admission: RE | Admit: 2012-12-10 | Discharge: 2012-12-10 | Disposition: A | Discharge status: Home / Self Care | Payer: Medicare Other | Source: Ambulatory Visit | Attending: Cardiovascular Disease | Admitting: Cardiovascular Disease

## 2012-12-10 NOTE — Progress Notes (Signed)
Shaunak's systolic blood pressure were in the mid 90's to low 100's.. Patient asymptomatic given Gatorade. Exit blood pressure 98/66. Gabriel has lost 2.6 kg since starting the program. Will notify Dr Excell Seltzer about Jaxxon's blood pressure today and fax today's exercise flow sheets to Dr Earmon Phoenix office for review. Sopheap exercised without difficulty this morning.

## 2012-12-12 ENCOUNTER — Encounter: Payer: Self-pay | Admitting: Physician Assistant

## 2012-12-12 ENCOUNTER — Encounter (HOSPITAL_COMMUNITY)
Admission: RE | Admit: 2012-12-12 | Discharge: 2012-12-12 | Disposition: A | Discharge status: Home / Self Care | Payer: Medicare Other | Source: Ambulatory Visit | Attending: Cardiovascular Disease | Admitting: Cardiovascular Disease

## 2012-12-12 ENCOUNTER — Other Ambulatory Visit (HOSPITAL_COMMUNITY): Payer: Medicare Other

## 2012-12-12 ENCOUNTER — Ambulatory Visit (HOSPITAL_COMMUNITY): Payer: Medicare Other | Attending: Cardiovascular Disease | Admitting: Cardiology

## 2012-12-12 DIAGNOSIS — I2589 Other forms of chronic ischemic heart disease: Secondary | ICD-10-CM

## 2012-12-12 DIAGNOSIS — E785 Hyperlipidemia, unspecified: Secondary | ICD-10-CM | POA: Insufficient documentation

## 2012-12-12 DIAGNOSIS — I10 Essential (primary) hypertension: Secondary | ICD-10-CM | POA: Insufficient documentation

## 2012-12-12 DIAGNOSIS — I079 Rheumatic tricuspid valve disease, unspecified: Secondary | ICD-10-CM | POA: Insufficient documentation

## 2012-12-12 DIAGNOSIS — I255 Ischemic cardiomyopathy: Secondary | ICD-10-CM

## 2012-12-12 DIAGNOSIS — I2510 Atherosclerotic heart disease of native coronary artery without angina pectoris: Secondary | ICD-10-CM | POA: Insufficient documentation

## 2012-12-12 DIAGNOSIS — I509 Heart failure, unspecified: Secondary | ICD-10-CM | POA: Insufficient documentation

## 2012-12-12 DIAGNOSIS — I379 Nonrheumatic pulmonary valve disorder, unspecified: Secondary | ICD-10-CM | POA: Insufficient documentation

## 2012-12-12 DIAGNOSIS — I5022 Chronic systolic (congestive) heart failure: Secondary | ICD-10-CM

## 2012-12-12 NOTE — Progress Notes (Signed)
Echo performed. 

## 2012-12-14 ENCOUNTER — Encounter (HOSPITAL_COMMUNITY)
Admission: RE | Admit: 2012-12-14 | Discharge: 2012-12-14 | Disposition: A | Discharge status: Home / Self Care | Payer: Medicare Other | Source: Ambulatory Visit | Attending: Cardiovascular Disease | Admitting: Cardiovascular Disease

## 2012-12-17 ENCOUNTER — Encounter (HOSPITAL_COMMUNITY)
Admission: RE | Admit: 2012-12-17 | Discharge: 2012-12-17 | Disposition: A | Discharge status: Home / Self Care | Payer: Medicare Other | Source: Ambulatory Visit | Attending: Cardiovascular Disease | Admitting: Cardiovascular Disease

## 2012-12-19 ENCOUNTER — Encounter (HOSPITAL_COMMUNITY)
Admission: RE | Admit: 2012-12-19 | Discharge: 2012-12-19 | Disposition: A | Discharge status: Home / Self Care | Payer: Medicare Other | Source: Ambulatory Visit | Attending: Cardiovascular Disease | Admitting: Cardiovascular Disease

## 2012-12-20 ENCOUNTER — Encounter: Payer: Self-pay | Admitting: Cardiovascular Disease

## 2012-12-20 ENCOUNTER — Ambulatory Visit (INDEPENDENT_AMBULATORY_CARE_PROVIDER_SITE_OTHER): Payer: Medicare Other | Admitting: Cardiovascular Disease

## 2012-12-20 VITALS — BP 101/62 | HR 59 | Ht 69.0 in | Wt 162.4 lb

## 2012-12-20 DIAGNOSIS — I2510 Atherosclerotic heart disease of native coronary artery without angina pectoris: Secondary | ICD-10-CM

## 2012-12-20 DIAGNOSIS — E785 Hyperlipidemia, unspecified: Secondary | ICD-10-CM

## 2012-12-20 NOTE — Progress Notes (Signed)
HPI:  77 year old gentleman presenting for followup of coronary artery disease. The patient presented in September 2014 with an anterior wall infarction. He was found to have total occlusion of the LAD and was treated with primary PCI using a drug-eluting stent. He had an echocardiogram December 10 which showed that his left ventricular ejection fraction had improved to 45-50% (was 25-30% at time of MI). Lipids were checked October 3 with a cholesterol of 131, triglycerides 177, HDL 37, and LDL 58.  The patient is doing very well. He is participating in cardiac rehabilitation without symptoms of exertion. Early on after his MI he had some issues with lightheadedness, but these have resolved. He does note systolic blood pressures in the 90s periodically at rehabilitation. He has not had chest pain, shortness of breath, or leg swelling. He denies myalgias. He's been compliant with his medications.  Outpatient Encounter Prescriptions as of 12/20/2012  Medication Sig  . aspirin EC 81 MG EC tablet Take 1 tablet (81 mg total) by mouth daily.  Marland Kitchen atorvastatin (LIPITOR) 80 MG tablet Take 1 tablet (80 mg total) by mouth daily at 6 PM.  . BRILINTA 90 MG TABS tablet TAKE 1 TABLET BY MOUTH TWICE A DAY  . carvedilol (COREG) 3.125 MG tablet TAKE 1 TABLET BY MOUTH TWICE A DAY WITH A MEAL  . losartan (COZAAR) 50 MG tablet Take 1 tablet (50 mg total) by mouth daily.  . nitroGLYCERIN (NITROSTAT) 0.4 MG SL tablet Place 1 tablet (0.4 mg total) under the tongue every 5 (five) minutes x 3 doses as needed for chest pain.  Marland Kitchen spironolactone (ALDACTONE) 25 MG tablet Take 1 tablet (25 mg total) by mouth daily.    No Known Allergies  Past Medical History  Diagnosis Date  . Hyperlipidemia   . HTN (hypertension)   . Seasonal allergies   . BPH (benign prostatic hyperplasia)   . Ischemic cardiomyopathy     a.  Echocardiogram 09/06/12: EF 25-30%, mid to apical anteroseptal and inferoseptal AK, apical lateral AK, mid to  apical anterior severe HK, true apex AK, grade 1 diastolic dysfunction, trivial MR, mildly reduced RVSF, PASP 38;   b. Echo (12/2012): Mild LVH, EF 45-50%, PASP 40  . CAD (coronary artery disease)     a. Anterior STEMI (9/14) => s/p DES-mid LAD (LHC 09/06/12: LAD occluded, after reperfusion distal LAD 40%, oCFX 80%, RCA with minimal irregularities, EF 40% with distal anterior, apical and infra-apical AK. PCI: Xience (3 x 18 mm) DES to the mid LAD. CFX was a small vessel and medical therapy was planned)  . Chronic systolic CHF (congestive heart failure)     ROS: Negative except as per HPI  BP 101/62  Pulse 59  Ht 5\' 9"  (1.753 m)  Wt 162 lb 6.4 oz (73.664 kg)  BMI 23.97 kg/m2  PHYSICAL EXAM: Pt is alert and oriented, NAD HEENT: normal Neck: JVP - normal, carotids 2+= without bruits Lungs: CTA bilaterally CV: RRR without murmur or gallop Abd: soft, NT, Positive BS, no hepatomegaly Ext: no C/C/E, distal pulses intact and equal Skin: warm/dry no rash  2D Echo 12/12/2012: Study Conclusions  - Left ventricle: The cavity size was normal. Wall thickness was increased in a pattern of mild LVH. Systolic function was mildly reduced. The estimated ejection fraction was in the range of 45% to 50%. There was an increased relative contribution of atrial contraction to ventricular filling. - Right ventricle: Systolic function was mildly reduced. - Pulmonary arteries: Systolic pressure was  mildly increased. PA peak pressure: 40mm Hg (S).  ASSESSMENT AND PLAN: 1. Coronary artery disease, native vessel. The patient is doing well after his myocardial infarction now 3 months ago. He remains on dual antiplatelet therapy with aspirin and brilinta. He is tolerating the combination of low-dose carvedilol, spironolactone, and losartan. Based on his blood pressure readings, I do not think he will tolerate an increase in dosage. He has no complaints today. I encouraged him to continue his exercise program  after he graduates from cardiac rehabilitation. I will see him back in 4 months with lipids and LFTs prior to that office visit.  2. Hyperlipidemia. Lipids reviewed as above. He remains on high-dose atorvastatin. Depending on his lipid panel when he returns, may consider reducing his dose to 40 mg.  Tonny Bollman 12/20/2012 10:20 AM

## 2012-12-20 NOTE — Patient Instructions (Signed)
Your physician wants you to follow-up in: 4 MONTHS with Dr Excell Seltzer (April 2015). You will receive a reminder letter in the mail two months in advance. If you don't receive a letter, please call our office to schedule the follow-up appointment.  Your physician recommends that you return for a FASTING LIPID and LIVER in 4 MONTHS (one week prior to Dr Excell Seltzer appointment)--nothing to eat or drink after midnight, lab opens at 7:30 AM  Your physician recommends that you continue on your current medications as directed. Please refer to the Current Medication list given to you today.

## 2012-12-21 ENCOUNTER — Encounter (HOSPITAL_COMMUNITY)
Admission: RE | Admit: 2012-12-21 | Discharge: 2012-12-21 | Disposition: A | Discharge status: Home / Self Care | Payer: Medicare Other | Source: Ambulatory Visit | Attending: Cardiovascular Disease | Admitting: Cardiovascular Disease

## 2012-12-24 ENCOUNTER — Encounter (HOSPITAL_COMMUNITY)
Admission: RE | Admit: 2012-12-24 | Discharge: 2012-12-24 | Disposition: A | Discharge status: Home / Self Care | Payer: Medicare Other | Source: Ambulatory Visit | Attending: Cardiovascular Disease | Admitting: Cardiovascular Disease

## 2012-12-26 ENCOUNTER — Encounter (HOSPITAL_COMMUNITY)
Admission: RE | Admit: 2012-12-26 | Discharge: 2012-12-26 | Disposition: A | Discharge status: Home / Self Care | Payer: Medicare Other | Source: Ambulatory Visit | Attending: Cardiovascular Disease | Admitting: Cardiovascular Disease

## 2012-12-31 ENCOUNTER — Telehealth (HOSPITAL_COMMUNITY): Payer: Self-pay | Admitting: Internal Medicine

## 2012-12-31 ENCOUNTER — Encounter (HOSPITAL_COMMUNITY): Payer: Medicare Other

## 2013-01-02 ENCOUNTER — Encounter (HOSPITAL_COMMUNITY)
Admission: RE | Admit: 2013-01-02 | Discharge: 2013-01-02 | Disposition: A | Discharge status: Home / Self Care | Payer: Medicare Other | Source: Ambulatory Visit | Attending: Cardiovascular Disease | Admitting: Cardiovascular Disease

## 2013-01-02 ENCOUNTER — Other Ambulatory Visit (HOSPITAL_COMMUNITY): Payer: Self-pay | Admitting: Physician Assistant

## 2013-01-04 ENCOUNTER — Encounter (HOSPITAL_COMMUNITY)
Admission: RE | Admit: 2013-01-04 | Discharge: 2013-01-04 | Disposition: A | Discharge status: Home / Self Care | Payer: Medicare Other | Source: Ambulatory Visit | Attending: Cardiovascular Disease | Admitting: Cardiovascular Disease

## 2013-01-04 DIAGNOSIS — Z5189 Encounter for other specified aftercare: Secondary | ICD-10-CM | POA: Insufficient documentation

## 2013-01-04 DIAGNOSIS — I2589 Other forms of chronic ischemic heart disease: Secondary | ICD-10-CM | POA: Insufficient documentation

## 2013-01-04 DIAGNOSIS — Z9861 Coronary angioplasty status: Secondary | ICD-10-CM | POA: Insufficient documentation

## 2013-01-04 DIAGNOSIS — I2109 ST elevation (STEMI) myocardial infarction involving other coronary artery of anterior wall: Secondary | ICD-10-CM | POA: Insufficient documentation

## 2013-01-06 ENCOUNTER — Other Ambulatory Visit (HOSPITAL_COMMUNITY): Payer: Self-pay | Admitting: Physician Assistant

## 2013-01-07 ENCOUNTER — Encounter (HOSPITAL_COMMUNITY)
Admission: RE | Admit: 2013-01-07 | Discharge: 2013-01-07 | Disposition: A | Discharge status: Home / Self Care | Payer: Medicare Other | Source: Ambulatory Visit | Attending: Cardiovascular Disease | Admitting: Cardiovascular Disease

## 2013-01-09 ENCOUNTER — Encounter (HOSPITAL_COMMUNITY)
Admission: RE | Admit: 2013-01-09 | Discharge: 2013-01-09 | Disposition: A | Discharge status: Home / Self Care | Payer: Medicare Other | Source: Ambulatory Visit | Attending: Cardiovascular Disease | Admitting: Cardiovascular Disease

## 2013-01-09 ENCOUNTER — Other Ambulatory Visit (HOSPITAL_COMMUNITY): Payer: Self-pay | Admitting: Physician Assistant

## 2013-01-11 ENCOUNTER — Encounter (HOSPITAL_COMMUNITY): Admission: RE | Admit: 2013-01-11 | Payer: Medicare Other | Source: Ambulatory Visit

## 2013-01-14 ENCOUNTER — Encounter (HOSPITAL_COMMUNITY)
Admission: RE | Admit: 2013-01-14 | Discharge: 2013-01-14 | Disposition: A | Discharge status: Home / Self Care | Payer: Medicare Other | Source: Ambulatory Visit | Attending: Cardiovascular Disease | Admitting: Cardiovascular Disease

## 2013-01-16 ENCOUNTER — Encounter (HOSPITAL_COMMUNITY)
Admission: RE | Admit: 2013-01-16 | Discharge: 2013-01-16 | Disposition: A | Discharge status: Home / Self Care | Payer: Medicare Other | Source: Ambulatory Visit | Attending: Cardiovascular Disease | Admitting: Cardiovascular Disease

## 2013-01-18 ENCOUNTER — Encounter (HOSPITAL_COMMUNITY)
Admission: RE | Admit: 2013-01-18 | Discharge: 2013-01-18 | Disposition: A | Discharge status: Home / Self Care | Payer: Medicare Other | Source: Ambulatory Visit | Attending: Cardiovascular Disease | Admitting: Cardiovascular Disease

## 2013-01-21 ENCOUNTER — Encounter (HOSPITAL_COMMUNITY)
Admission: RE | Admit: 2013-01-21 | Discharge: 2013-01-21 | Disposition: A | Discharge status: Home / Self Care | Payer: Medicare Other | Source: Ambulatory Visit | Attending: Cardiovascular Disease | Admitting: Cardiovascular Disease

## 2013-01-21 ENCOUNTER — Encounter: Payer: Self-pay | Admitting: Cardiovascular Disease

## 2013-01-23 ENCOUNTER — Encounter (HOSPITAL_COMMUNITY)
Admission: RE | Admit: 2013-01-23 | Discharge: 2013-01-23 | Disposition: A | Discharge status: Home / Self Care | Payer: Medicare Other | Source: Ambulatory Visit | Attending: Cardiovascular Disease | Admitting: Cardiovascular Disease

## 2013-01-25 ENCOUNTER — Encounter (HOSPITAL_COMMUNITY)
Admission: RE | Admit: 2013-01-25 | Discharge: 2013-01-25 | Disposition: A | Discharge status: Home / Self Care | Payer: Medicare Other | Source: Ambulatory Visit | Attending: Cardiovascular Disease | Admitting: Cardiovascular Disease

## 2013-01-28 ENCOUNTER — Encounter (HOSPITAL_COMMUNITY)
Admission: RE | Admit: 2013-01-28 | Discharge: 2013-01-28 | Disposition: A | Discharge status: Home / Self Care | Payer: Medicare Other | Source: Ambulatory Visit | Attending: Cardiovascular Disease | Admitting: Cardiovascular Disease

## 2013-01-28 NOTE — Progress Notes (Signed)
Jesus Warner graduated this morning. Jesus Warner plans to continue exercise on his own via walking.

## 2013-02-06 ENCOUNTER — Other Ambulatory Visit (HOSPITAL_COMMUNITY): Payer: Self-pay | Admitting: Cardiovascular Disease

## 2013-02-07 ENCOUNTER — Other Ambulatory Visit: Payer: Self-pay

## 2013-02-07 MED ORDER — TICAGRELOR 90 MG PO TABS: ORAL_TABLET | ORAL | Status: DC

## 2013-03-18 ENCOUNTER — Ambulatory Visit (INDEPENDENT_AMBULATORY_CARE_PROVIDER_SITE_OTHER): Payer: Medicare Other | Admitting: Physician Assistant

## 2013-03-18 ENCOUNTER — Encounter: Payer: Self-pay | Admitting: Physician Assistant

## 2013-03-18 ENCOUNTER — Other Ambulatory Visit: Payer: Medicare Other

## 2013-03-18 VITALS — BP 101/79 | HR 52 | Ht 69.0 in | Wt 158.0 lb

## 2013-03-18 DIAGNOSIS — I252 Old myocardial infarction: Secondary | ICD-10-CM

## 2013-03-18 DIAGNOSIS — I10 Essential (primary) hypertension: Secondary | ICD-10-CM

## 2013-03-18 DIAGNOSIS — I2510 Atherosclerotic heart disease of native coronary artery without angina pectoris: Secondary | ICD-10-CM

## 2013-03-18 DIAGNOSIS — E785 Hyperlipidemia, unspecified: Secondary | ICD-10-CM

## 2013-03-18 DIAGNOSIS — I2589 Other forms of chronic ischemic heart disease: Secondary | ICD-10-CM

## 2013-03-18 LAB — BASIC METABOLIC PANEL
BUN: 27 mg/dL — AB (ref 6–23)
CALCIUM: 8.9 mg/dL (ref 8.4–10.5)
CO2: 26 mEq/L (ref 19–32)
Chloride: 108 mEq/L (ref 96–112)
Creatinine, Ser: 1.5 mg/dL (ref 0.4–1.5)
GFR: 47.62 mL/min — ABNORMAL LOW (ref >60.00)
GLUCOSE: 95 mg/dL (ref 70–99)
POTASSIUM: 4.8 meq/L (ref 3.5–5.1)
SODIUM: 138 meq/L (ref 135–145)

## 2013-03-18 NOTE — Addendum Note (Signed)
Addended by: Eulis Foster on: 03/18/2013 09:20 AM   Modules accepted: Orders

## 2013-03-18 NOTE — Progress Notes (Signed)
[place], [place], [place]  [address] Phone: [phone] Fax:  [phone]  Date:  03/18/2013   ID:  Jesus Warner, DOB 26-Feb-1935, MRN [id number]  PCP:  [name], MD  Cardiologist:  Dr. [name]     History of Present Illness: Jesus Warner is a 78 y.o. male with a hx of CAD, ICM with EF 76-73%, systolic CHF, HTN, HL.  He presented with anterior STEMI in 09/2012 and was treated with a DES to the LAD.  He did have a small CFX with 80% stenosis and this was treated medically.  EF was down and he was d/c on a LifeVest but the patient later d/c'd this.  His EF improved to 45-50% by follow up echo in 12/2012.  Last seen by Dr. [name] in 12/2012.  He has completed cardiac rehab.    He is here with his wife.  He is overall doing well.  The patient denies chest pain, shortness of breath, syncope, orthopnea, PND or significant pedal edema. He feels tired sometimes.   Recent Labs: 09/06/2012: TSH 1.873  09/07/2012: Hemoglobin 13.2  10/05/2012: ALT 31; Creatinine 1.4; HDL Cholesterol 37.40*; LDL (calc) 58; Potassium 4.2   Wt Readings from Last 3 Encounters:  03/18/13 158 lb (71.668 kg)  12/20/12 162 lb 6.4 oz (73.664 kg)  10/18/12 166 lb 7.2 oz (75.5 kg)     Past Medical History  Diagnosis Date  . Hyperlipidemia   . HTN (hypertension)   . Seasonal allergies   . BPH (benign prostatic hyperplasia)   . Ischemic cardiomyopathy     a.  Echocardiogram 09/06/12: EF 25-30%, mid to apical anteroseptal and inferoseptal AK, apical lateral AK, mid to apical anterior severe HK, true apex AK, grade 1 diastolic dysfunction, trivial MR, mildly reduced RVSF, PASP 38;   b. Echo (12/2012): Mild LVH, EF 45-50%, PASP 40  . CAD (coronary artery disease)     a. Anterior STEMI (9/14) => s/p DES-mid LAD (LHC 09/06/12: LAD occluded, after reperfusion distal LAD 40%, oCFX 80%, RCA with minimal irregularities, EF 40% with distal anterior, apical and infra-apical AK. PCI: Xience (3 x  18 mm) DES to the mid LAD. CFX was a small vessel and medical therapy was planned)  . Chronic systolic CHF (congestive heart failure)     Current Outpatient Prescriptions  Medication Sig Dispense Refill  . aspirin EC 81 MG EC tablet Take 1 tablet (81 mg total) by mouth daily.      Marland Kitchen atorvastatin (LIPITOR) 80 MG tablet TAKE 1 TABLET BY MOUTH EVERY DAY AT 6PM  30 tablet  3  . carvedilol (COREG) 3.125 MG tablet TAKE 1 TABLET BY MOUTH TWICE A DAY WITH A MEAL  60 tablet  0  . losartan (COZAAR) 50 MG tablet Take 1 tablet (50 mg total) by mouth daily.  30 tablet  3  . nitroGLYCERIN (NITROSTAT) 0.4 MG SL tablet Place 1 tablet (0.4 mg total) under the tongue every 5 (five) minutes x 3 doses as needed for chest pain.  25 tablet  3  . spironolactone (ALDACTONE) 25 MG tablet TAKE 1 TABLET BY MOUTH EVERY DAY  30 tablet  3  . Ticagrelor (BRILINTA) 90 MG TABS tablet TAKE 1 TABLET BY MOUTH TWICE A DAY  60 tablet  0   No current facility-administered medications for this visit.    Allergies:   Review of patient's allergies indicates no known allergies.   Social History:  The patient  reports that he has never smoked. He does not have any smokeless tobacco history on file. He reports that he does not drink alcohol or use illicit drugs.   Family History:  The patient's family history includes Cancer in his mother; Emphysema in his father.   ROS:  Please see the history of present illness.   No bleeding problems.   All other systems reviewed and negative.   PHYSICAL EXAM: VS:  BP 101/79  Pulse 52  Ht 5\' 9"  (1.753 m)  Wt 158 lb (71.668 kg)  BMI 23.32 kg/m2 Well nourished, well developed, in no acute distress HEENT: normal Neck: no JVD Vascular:  No carotid bruits Cardiac:  normal S1, S2; RRR; no murmur Lungs:  clear to auscultation bilaterally, no wheezing, rhonchi or rales Abd: soft, nontender, no hepatomegaly Ext: no edema Skin: warm and dry Neuro:  CNs 2-12 intact, no focal abnormalities  noted  EKG:  Sinus brady, HR 52, low voltage, NSSTTW changes     ASSESSMENT AND PLAN:  1. CAD:  No angina.  Continue ASA, Brilinta, statin. 2. Ischemic Cardiomyopathy:  EF recovered to near normal.  Continue beta blocker, ARB, Spironolactone at current doses as his BP will not tolerate any further increase. 3. Hypertension:  Controlled.  4. Hyperlipidemia:  Check fasting labs today.  If LDL low, consider changing Lipitor to 40 QD.  5. Disposition:  F/u with Dr. [name] in 6 mos.   Signed, Richardson Dopp, PA-C  03/18/2013 9:03 AM

## 2013-03-18 NOTE — Addendum Note (Signed)
Addended by: Eulis Foster on: 03/18/2013 03:44 PM   Modules accepted: Orders

## 2013-03-18 NOTE — Patient Instructions (Signed)
Your physician recommends that you return for lab work today for Bmet, hepatic and lipids. FASTING  Your physician wants you to follow-up in: 6 months with Dr. Burt Knack. You will receive a reminder letter in the mail two months in advance. If you don't receive a letter, please call our office to schedule the follow-up appointment.

## 2013-04-08 ENCOUNTER — Other Ambulatory Visit: Payer: Self-pay | Admitting: Cardiovascular Disease

## 2013-05-09 ENCOUNTER — Other Ambulatory Visit (HOSPITAL_COMMUNITY): Payer: Self-pay | Admitting: Cardiovascular Disease

## 2013-05-17 ENCOUNTER — Other Ambulatory Visit: Payer: Self-pay

## 2013-05-17 MED ORDER — ATORVASTATIN CALCIUM 80 MG PO TABS: ORAL_TABLET | ORAL | Status: DC

## 2013-05-22 ENCOUNTER — Other Ambulatory Visit: Payer: Self-pay

## 2013-05-22 MED ORDER — CARVEDILOL 3.125 MG PO TABS: ORAL_TABLET | ORAL | Status: DC

## 2013-05-22 MED ORDER — LOSARTAN POTASSIUM 50 MG PO TABS: 50.0000 mg | ORAL_TABLET | Freq: Every day | ORAL | Status: DC

## 2013-05-22 MED ORDER — SPIRONOLACTONE 25 MG PO TABS: ORAL_TABLET | ORAL | Status: DC

## 2013-06-07 ENCOUNTER — Ambulatory Visit: Payer: Medicare Other | Admitting: Cardiovascular Disease

## 2013-09-18 ENCOUNTER — Encounter: Payer: Self-pay | Admitting: Cardiovascular Disease

## 2013-09-18 ENCOUNTER — Ambulatory Visit (INDEPENDENT_AMBULATORY_CARE_PROVIDER_SITE_OTHER): Payer: Medicare Other | Admitting: Cardiovascular Disease

## 2013-09-18 VITALS — BP 110/62 | HR 62 | Ht 69.0 in | Wt 164.0 lb

## 2013-09-18 DIAGNOSIS — I10 Essential (primary) hypertension: Secondary | ICD-10-CM

## 2013-09-18 DIAGNOSIS — E785 Hyperlipidemia, unspecified: Secondary | ICD-10-CM

## 2013-09-18 DIAGNOSIS — I255 Ischemic cardiomyopathy: Secondary | ICD-10-CM

## 2013-09-18 DIAGNOSIS — I2589 Other forms of chronic ischemic heart disease: Secondary | ICD-10-CM

## 2013-09-18 DIAGNOSIS — I2510 Atherosclerotic heart disease of native coronary artery without angina pectoris: Secondary | ICD-10-CM

## 2013-09-18 MED ORDER — ATORVASTATIN CALCIUM 40 MG PO TABS: ORAL_TABLET | ORAL | Status: DC

## 2013-09-18 MED ORDER — NITROGLYCERIN 0.4 MG SL SUBL: 0.4000 mg | SUBLINGUAL_TABLET | SUBLINGUAL | Status: DC | PRN

## 2013-09-18 NOTE — Progress Notes (Signed)
HPI:  78 year old gentleman presenting for followup of coronary artery disease with ischemic cardiomyopathy. He presented one year ago with an anterior wall MI treated with a drug-eluting stent to the LAD. LVEF improved from 25-30% up to 45-50% after a few months of medical therapy. Last lipids from October 2014 shows a cholesterol of 131, triglycerides 177, HDL 37, and LDL 58. LFTs were within normal limits.  The patient is doing well. He denies chest pain, chest pressure, shortness of breath, or leg swelling. He notes easy bruising since he's been on aspirin and brilinta. He looks forward to going hunting this fall. He's been physically active and walks regularly for exercise. He's been following a prudent diet.  Outpatient Encounter Prescriptions as of 09/18/2013  Medication Sig  . aspirin EC 81 MG EC tablet Take 1 tablet (81 mg total) by mouth daily.  Marland Kitchen atorvastatin (LIPITOR) 80 MG tablet TAKE 1 TABLET BY MOUTH EVERY DAY AT 6PM  . BRILINTA 90 MG TABS tablet TAKE 1 TABLET BY MOUTH TWICE A DAY  . carvedilol (COREG) 3.125 MG tablet TAKE 1 TABLET BY MOUTH TWICE A DAY WITH A MEAL  . losartan (COZAAR) 50 MG tablet Take 1 tablet (50 mg total) by mouth daily.  . MULTIPLE VITAMIN PO Take 1 tablet by mouth daily.  . nitroGLYCERIN (NITROSTAT) 0.4 MG SL tablet Place 1 tablet (0.4 mg total) under the tongue every 5 (five) minutes x 3 doses as needed for chest pain.  Marland Kitchen spironolactone (ALDACTONE) 25 MG tablet TAKE 1 TABLET BY MOUTH EVERY DAY    No Known Allergies  Past Medical History  Diagnosis Date  . Hyperlipidemia   . HTN (hypertension)   . Seasonal allergies   . BPH (benign prostatic hyperplasia)   . Ischemic cardiomyopathy     a.  Echocardiogram 09/06/12: EF 25-30%, mid to apical anteroseptal and inferoseptal AK, apical lateral AK, mid to apical anterior severe HK, true apex AK, grade 1 diastolic dysfunction, trivial MR, mildly reduced RVSF, PASP 38;   b. Echo (12/2012): Mild LVH, EF 45-50%,  PASP 40  . CAD (coronary artery disease)     a. Anterior STEMI (9/14) => s/p DES-mid LAD (LHC 09/06/12: LAD occluded, after reperfusion distal LAD 40%, oCFX 80%, RCA with minimal irregularities, EF 40% with distal anterior, apical and infra-apical AK. PCI: Xience (3 x 18 mm) DES to the mid LAD. CFX was a small vessel and medical therapy was planned)  . Chronic systolic CHF (congestive heart failure)     ROS: Negative except as per HPI  BP 110/62  Pulse 62  Ht 5\' 9"  (1.753 m)  Wt 164 lb (74.39 kg)  BMI 24.21 kg/m2  PHYSICAL EXAM: Pt is alert and oriented, NAD HEENT: normal Neck: JVP - normal, carotids 2+= without bruits Lungs: CTA bilaterally CV: RRR without murmur or gallop Abd: soft, NT, Positive BS, no hepatomegaly Ext: no C/C/E, distal pulses intact and equal Skin: warm/dry no rash  EKG:  Normal sinus rhythm with intermittent left bundle branch block.  ASSESSMENT AND PLAN: 1. Coronary artery disease, native vessel without angina, old MI. The patient had an anterior MI one year ago. He's had improvement in LV function. He has no symptoms of congestive heart failure. He was treated with a single drug-eluting stent in the LAD postdilated to 3.5 mm. Per guideline recommendations he can stop brilinta now he has 12 months out from his infarct. He should remain on long-term aspirin 81 mg.  2. Hyperlipidemia. Most  recent lipids viewed. Will reduce atorvastatin 40 mg daily. Repeat lipids and LFTs in 3 months.  3. Ischemic cardiomyopathy. The patient is tolerating a regimen of low-dose carvedilol, losartan, and Aldactone. Will check a metabolic panel to assess potassium as it has been 6 months(spironolactone monitoring).  For followup I will see him back in 6 months.  [name] 09/18/2013 11:45 AM

## 2013-09-18 NOTE — Patient Instructions (Signed)
Your physician has recommended you make the following change in your medication:  1. DECREASE Atorvastatin to 40mg  once a day 2. You can STOP Brilinta when you finish your current supply  Your physician recommends that you have lab work today: BMP  Your physician recommends that you return for a FASTING LIPID and LIVER profile in 3 MONTHS--nothing to eat or drink after midnight, lab opens at 7:30 AM  Your physician wants you to follow-up in: 6 MONTHS with Dr Burt Knack.  You will receive a reminder letter in the mail two months in advance. If you don't receive a letter, please call our office to schedule the follow-up appointment.

## 2013-09-19 LAB — BASIC METABOLIC PANEL
BUN: 23 mg/dL (ref 6–23)
CALCIUM: 9.1 mg/dL (ref 8.4–10.5)
CO2: 26 meq/L (ref 19–32)
Chloride: 104 mEq/L (ref 96–112)
Creatinine, Ser: 1.6 mg/dL — ABNORMAL HIGH (ref 0.4–1.5)
GFR: 45.81 mL/min — AB (ref >60.00)
Glucose, Bld: 101 mg/dL — ABNORMAL HIGH (ref 70–99)
Potassium: 4.5 mEq/L (ref 3.5–5.1)
SODIUM: 135 meq/L (ref 135–145)

## 2013-12-01 ENCOUNTER — Other Ambulatory Visit: Payer: Self-pay | Admitting: Cardiovascular Disease

## 2013-12-12 ENCOUNTER — Encounter (HOSPITAL_COMMUNITY): Payer: Self-pay | Admitting: Cardiovascular Disease

## 2013-12-19 ENCOUNTER — Other Ambulatory Visit: Payer: Medicare Other

## 2013-12-23 ENCOUNTER — Other Ambulatory Visit (INDEPENDENT_AMBULATORY_CARE_PROVIDER_SITE_OTHER): Payer: Medicare Other | Admitting: *Deleted

## 2013-12-23 DIAGNOSIS — I2510 Atherosclerotic heart disease of native coronary artery without angina pectoris: Secondary | ICD-10-CM

## 2013-12-23 DIAGNOSIS — E785 Hyperlipidemia, unspecified: Secondary | ICD-10-CM

## 2013-12-23 LAB — HEPATIC FUNCTION PANEL
ALT: 20 U/L (ref 0–53)
AST: 28 U/L (ref 0–37)
Albumin: 3.9 g/dL (ref 3.5–5.2)
Alkaline Phosphatase: 81 U/L (ref 39–117)
BILIRUBIN DIRECT: 0.1 mg/dL (ref 0.0–0.3)
BILIRUBIN TOTAL: 0.9 mg/dL (ref 0.2–1.2)
TOTAL PROTEIN: 6.7 g/dL (ref 6.0–8.3)

## 2013-12-23 LAB — LIPID PANEL
Cholesterol: 166 mg/dL (ref 0–200)
HDL: 38.1 mg/dL — ABNORMAL LOW (ref >39.00)
LDL CALC: 107 mg/dL — AB (ref 0–99)
NonHDL: 127.9
TRIGLYCERIDES: 103 mg/dL (ref 0.0–149.0)
Total CHOL/HDL Ratio: 4
VLDL: 20.6 mg/dL (ref 0.0–40.0)

## 2014-01-01 ENCOUNTER — Other Ambulatory Visit: Payer: Self-pay

## 2014-01-01 MED ORDER — ATORVASTATIN CALCIUM 80 MG PO TABS: ORAL_TABLET | ORAL | Status: DC

## 2014-01-27 ENCOUNTER — Other Ambulatory Visit: Payer: Self-pay | Admitting: Cardiovascular Disease

## 2014-03-20 ENCOUNTER — Ambulatory Visit (INDEPENDENT_AMBULATORY_CARE_PROVIDER_SITE_OTHER): Payer: Medicare Other | Admitting: Cardiovascular Disease

## 2014-03-20 ENCOUNTER — Encounter: Payer: Self-pay | Admitting: Cardiovascular Disease

## 2014-03-20 VITALS — BP 110/78 | HR 54 | Ht 70.0 in | Wt 170.8 lb

## 2014-03-20 DIAGNOSIS — I2510 Atherosclerotic heart disease of native coronary artery without angina pectoris: Secondary | ICD-10-CM

## 2014-03-20 DIAGNOSIS — E785 Hyperlipidemia, unspecified: Secondary | ICD-10-CM

## 2014-03-20 DIAGNOSIS — I255 Ischemic cardiomyopathy: Secondary | ICD-10-CM

## 2014-03-20 NOTE — Progress Notes (Signed)
Cardiology Office Note Date:  03/20/2014   ID:  Jesus Warner, DOB [birth year], MRN [id number]  PCP:  [name], MD  Cardiologist:  [name], MD    No chief complaint on file.    History of Present Illness: Jesus Warner is a 79 y.o. male who presents for follow-up of coronary artery disease. The patient initially presented in 2014 with an anterior wall STEMI and he was treated with primary PCI using a drug-eluting stent in the LAD. He was noted to have 80% stenosis of a small left circumflex with medical therapy recommended. The patient initially had severe LV dysfunction, but his ejection fraction improved to the range of 45-50% by follow-up echocardiogram in December 2014. Other problems include hypertension and hyperlipidemia.  Overall the patient is doing fairly well. He complains of generalized fatigue, but otherwise no specific cardiac complaints. He's walking 3 times/week for exercise. No chest pain or shortness of breath. Denies lightheadedness, syncope, or edema.   Past Medical History  Diagnosis Date  . Hyperlipidemia   . HTN (hypertension)   . Seasonal allergies   . BPH (benign prostatic hyperplasia)   . Ischemic cardiomyopathy     a.  Echocardiogram 09/06/12: EF 25-30%, mid to apical anteroseptal and inferoseptal AK, apical lateral AK, mid to apical anterior severe HK, true apex AK, grade 1 diastolic dysfunction, trivial MR, mildly reduced RVSF, PASP 38;   b. Echo (12/2012): Mild LVH, EF 45-50%, PASP 40  . CAD (coronary artery disease)     a. Anterior STEMI (9/14) => s/p DES-mid LAD (LHC 09/06/12: LAD occluded, after reperfusion distal LAD 40%, oCFX 80%, RCA with minimal irregularities, EF 40% with distal anterior, apical and infra-apical AK. PCI: Xience (3 x 18 mm) DES to the mid LAD. CFX was a small vessel and medical therapy was planned)  . Chronic systolic CHF (congestive heart failure)     Past Surgical History  Procedure Laterality Date  . Coronary  angioplasty with stent placement  09/06/2012    total mid LAD occlusion s/p DES, 40% distal LAD, 80% ostial LCx (small vessel supplying single OM branch), widely patent RCA; akinesis of distal anterior, apical and inferoapical walls; EF 40%  . Left heart catheterization with coronary angiogram N/A 09/06/2012    Procedure: LEFT HEART CATHETERIZATION WITH CORONARY ANGIOGRAM;  Surgeon: [name], MD;  Location: [facility] CATH LAB;  Service: Cardiovascular;  Laterality: N/A;  . Percutaneous coronary stent intervention (pci-s)  09/06/2012    Procedure: PERCUTANEOUS CORONARY STENT INTERVENTION (PCI-S);  Surgeon: [name], MD;  Location: [facility] CATH LAB;  Service: Cardiovascular;;    Current Outpatient Prescriptions  Medication Sig Dispense Refill  . aspirin EC 81 MG EC tablet Take 1 tablet (81 mg total) by mouth daily.    Marland Kitchen atorvastatin (LIPITOR) 80 MG tablet TAKE 1 TABLET BY MOUTH EVERY DAY AT 6PM 90 tablet 3  . carvedilol (COREG) 3.125 MG tablet TAKE 1 TABLET BY MOUTH TWICE A DAY WITH A MEAL 180 tablet 1  . losartan (COZAAR) 50 MG tablet TAKE 1 TABLET BY MOUTH DAILY 90 tablet 0  . MULTIPLE VITAMIN PO Take 1 tablet by mouth daily. Pt states he only takes this once in a while    . nitroGLYCERIN (NITROSTAT) 0.4 MG SL tablet Place 1 tablet (0.4 mg total) under the tongue every 5 (five) minutes x 3 doses as needed for chest pain. 25 tablet 3  . spironolactone (ALDACTONE) 25 MG tablet TAKE 1 TABLET BY MOUTH EVERY DAY  90 tablet 1  . tamsulosin (FLOMAX) 0.4 MG CAPS capsule Take 0.4 mg by mouth every evening.  11   No current facility-administered medications for this visit.    Allergies:   Review of patient's allergies indicates no known allergies.   Social History:  The patient  reports that he has never smoked. He does not have any smokeless tobacco history on file. He reports that he does not drink alcohol or use illicit drugs.   Family History:  The patient's  family history includes Cancer in his  maternal aunt, maternal aunt, and mother; Diabetes in his mother; Emphysema in his father.    ROS:  Please see the history of present illness.  Otherwise, review of systems is positive for low back pain.  All other systems are reviewed and negative.    PHYSICAL EXAM: VS:  BP 110/78 mmHg  Pulse 54  Ht 5\' 10"  (1.778 m)  Wt 170 lb 12.8 oz (77.474 kg)  BMI 24.51 kg/m2 , BMI Body mass index is 24.51 kg/(m^2). GEN: Well nourished, well developed, in no acute distress HEENT: normal Neck: no JVD, no masses. No carotid bruits Cardiac: RRR without murmur or gallop                Respiratory:  clear to auscultation bilaterally, normal work of breathing GI: soft, nontender, nondistended, + BS MS: no deformity or atrophy Ext: no pretibial edema, pedal pulses 2+= bilaterally Skin: warm and dry, no rash Neuro:  Strength and sensation are intact Psych: euthymic mood, full affect  EKG:  EKG is ordered today. The ekg ordered today shows Sinus bradycardia 54 bpm, 1st degree AVB, otherwise WNL  Recent Labs: 09/18/2013: BUN 23; Creatinine 1.6*; Potassium 4.5; Sodium 135 12/23/2013: ALT 20   Lipid Panel     Component Value Date/Time   CHOL 166 12/23/2013 0805   TRIG 103.0 12/23/2013 0805   HDL 38.10* 12/23/2013 0805   CHOLHDL 4 12/23/2013 0805   VLDL 20.6 12/23/2013 0805   LDLCALC 107* 12/23/2013 0805      Wt Readings from Last 3 Encounters:  03/20/14 170 lb 12.8 oz (77.474 kg)  09/18/13 164 lb (74.39 kg)  03/18/13 158 lb (71.668 kg)     Cardiac Studies Reviewed: 2D Echo 12-12-2012: Study Conclusions  - Left ventricle: The cavity size was normal. Wall thickness was increased in a pattern of mild LVH. Systolic function was mildly reduced. The estimated ejection fraction was in the range of 45% to 50%. There was an increased relative contribution of atrial contraction to ventricular filling. - Right ventricle: Systolic function was mildly reduced. - Pulmonary arteries:  Systolic pressure was mildly increased. PA peak pressure: 27mm Hg (S).  ASSESSMENT AND PLAN: 1.  CAD, native vessel, with old MI: doing well without angina. Med Rx reviewed and he is on a good program. No changes made today.   2. Essential hypertension: BP in ideal range on aldactone, carvedilol, and losartan.  3. Hyperlipidemia: on atorvastatin at high-dose. LDL above goal but on high-intensity statin will continue same work on diet/exercise.   Current medicines are reviewed with the patient today.  The patient does not have concerns regarding medicines.  The following changes have been made:  no change  Labs/ tests ordered today include:  No orders of the defined types were placed in this encounter.    Disposition:   FU one year  Signed, [name], MD  03/20/2014 9:12 AM    [place]  [address], [place], [place]  [address] Phone: [phone]; Fax: [phone]

## 2014-03-20 NOTE — Patient Instructions (Signed)
Your physician recommends that you continue on your current medications as directed. Please refer to the Current Medication list given to you today.  Your physician wants you to follow-up in: 1 YEAR with Dr Cooper.  You will receive a reminder letter in the mail two months in advance. If you don't receive a letter, please call our office to schedule the follow-up appointment.  

## 2014-05-20 ENCOUNTER — Other Ambulatory Visit: Payer: Self-pay | Admitting: Cardiovascular Disease

## 2014-06-02 ENCOUNTER — Other Ambulatory Visit: Payer: Self-pay | Admitting: Cardiovascular Disease

## 2014-08-11 ENCOUNTER — Ambulatory Visit
Admission: RE | Admit: 2014-08-11 | Discharge: 2014-08-11 | Disposition: A | Discharge status: Home / Self Care | Payer: Medicare Other | Source: Ambulatory Visit | Attending: Internal Medicine | Admitting: Internal Medicine

## 2014-08-11 ENCOUNTER — Other Ambulatory Visit: Payer: Self-pay | Admitting: Internal Medicine

## 2014-08-11 DIAGNOSIS — M5489 Other dorsalgia: Secondary | ICD-10-CM

## 2015-01-12 ENCOUNTER — Telehealth: Payer: Self-pay | Admitting: *Deleted

## 2015-01-12 MED ORDER — LOSARTAN POTASSIUM 50 MG PO TABS: 50.0000 mg | ORAL_TABLET | Freq: Every day | ORAL | Status: DC

## 2015-01-12 NOTE — Telephone Encounter (Signed)
Refill request sent from pts pharmacy [facility] for a 90 day supply of losartan potassium 50 mg po daily.  Refill sent to pts pharmacy of choice for a 90 day supply.  Pt has a follow-up appt with Dr Burt Knack on March 2017.

## 2015-01-20 ENCOUNTER — Other Ambulatory Visit: Payer: Self-pay | Admitting: Cardiovascular Disease

## 2015-02-09 DIAGNOSIS — M19011 Primary osteoarthritis, right shoulder: Secondary | ICD-10-CM | POA: Diagnosis not present

## 2015-02-10 DIAGNOSIS — L570 Actinic keratosis: Secondary | ICD-10-CM | POA: Diagnosis not present

## 2015-02-10 DIAGNOSIS — D225 Melanocytic nevi of trunk: Secondary | ICD-10-CM | POA: Diagnosis not present

## 2015-02-10 DIAGNOSIS — Z08 Encounter for follow-up examination after completed treatment for malignant neoplasm: Secondary | ICD-10-CM | POA: Diagnosis not present

## 2015-02-10 DIAGNOSIS — Z85828 Personal history of other malignant neoplasm of skin: Secondary | ICD-10-CM | POA: Diagnosis not present

## 2015-02-10 DIAGNOSIS — X32XXXD Exposure to sunlight, subsequent encounter: Secondary | ICD-10-CM | POA: Diagnosis not present

## 2015-03-20 ENCOUNTER — Ambulatory Visit (INDEPENDENT_AMBULATORY_CARE_PROVIDER_SITE_OTHER): Payer: Medicare Other | Admitting: Cardiovascular Disease

## 2015-03-20 ENCOUNTER — Encounter: Payer: Self-pay | Admitting: Cardiovascular Disease

## 2015-03-20 VITALS — BP 118/62 | HR 56 | Ht 70.0 in | Wt 171.0 lb

## 2015-03-20 DIAGNOSIS — I447 Left bundle-branch block, unspecified: Secondary | ICD-10-CM

## 2015-03-20 DIAGNOSIS — I2510 Atherosclerotic heart disease of native coronary artery without angina pectoris: Secondary | ICD-10-CM | POA: Diagnosis not present

## 2015-03-20 NOTE — Patient Instructions (Signed)

## 2015-03-20 NOTE — Progress Notes (Signed)
Cardiology Office Note Date:  03/21/2015   ID:  RIMAS BERTOT, DOB August 01, 1935, MRN [id number]  PCP:  [name], MD  Cardiologist:  [name], MD    Chief Complaint  Patient presents with  . Follow-up    CAD    History of Present Illness: Jesus Warner is a 80 y.o. male who presents for follow-up of coronary artery disease. The patient initially presented in 2014 with an anterior wall STEMI and he was treated with primary PCI using a drug-eluting stent in the LAD. He was noted to have 80% stenosis of a small left circumflex with medical therapy recommended. The patient initially had severe LV dysfunction, but his ejection fraction improved to the range of 45-50% by follow-up echocardiogram in December 2014. Other problems include hypertension and hyperlipidemia.  The patient is doing well. He's relatively active without exertional symptoms. Today, he denies symptoms of palpitations, chest pain, shortness of breath, orthopnea, PND, lower extremity edema, dizziness, or syncope. He is considering left shoulder replacement for chronic pain he's developed related to advanced osteoarthritis.   Past Medical History  Diagnosis Date  . Hyperlipidemia   . HTN (hypertension)   . Seasonal allergies   . BPH (benign prostatic hyperplasia)   . Ischemic cardiomyopathy     a.  Echocardiogram 09/06/12: EF 25-30%, mid to apical anteroseptal and inferoseptal AK, apical lateral AK, mid to apical anterior severe HK, true apex AK, grade 1 diastolic dysfunction, trivial MR, mildly reduced RVSF, PASP 38;   b. Echo (12/2012): Mild LVH, EF 45-50%, PASP 40  . CAD (coronary artery disease)     a. Anterior STEMI (9/14) => s/p DES-mid LAD (LHC 09/06/12: LAD occluded, after reperfusion distal LAD 40%, oCFX 80%, RCA with minimal irregularities, EF 40% with distal anterior, apical and infra-apical AK. PCI: Xience (3 x 18 mm) DES to the mid LAD. CFX was a small vessel and medical therapy was planned)  .  Chronic systolic CHF (congestive heart failure) [facility])     Past Surgical History  Procedure Laterality Date  . Coronary angioplasty with stent placement  09/06/2012    total mid LAD occlusion s/p DES, 40% distal LAD, 80% ostial LCx (small vessel supplying single OM branch), widely patent RCA; akinesis of distal anterior, apical and inferoapical walls; EF 40%  . Left heart catheterization with coronary angiogram N/A 09/06/2012    Procedure: LEFT HEART CATHETERIZATION WITH CORONARY ANGIOGRAM;  Surgeon: [name], MD;  Location: [facility] CATH LAB;  Service: Cardiovascular;  Laterality: N/A;  . Percutaneous coronary stent intervention (pci-s)  09/06/2012    Procedure: PERCUTANEOUS CORONARY STENT INTERVENTION (PCI-S);  Surgeon: [name], MD;  Location: [facility] CATH LAB;  Service: Cardiovascular;;    Current Outpatient Prescriptions  Medication Sig Dispense Refill  . aspirin EC 81 MG EC tablet Take 1 tablet (81 mg total) by mouth daily.    Marland Kitchen atorvastatin (LIPITOR) 80 MG tablet TAKE 1 TABLET BY MOUTH EVERY DAY AT 6PM 90 tablet 0  . carvedilol (COREG) 3.125 MG tablet TAKE 1 TABLET BY MOUTH TWICE A DAY WITH A MEAL 180 tablet 2  . finasteride (PROSCAR) 5 MG tablet Take 1 tablet by mouth daily.  0  . losartan (COZAAR) 50 MG tablet Take 1 tablet (50 mg total) by mouth daily. 90 tablet 5  . MULTIPLE VITAMIN PO Take 1 tablet by mouth daily. Pt states he only takes this once in a while    . nitroGLYCERIN (NITROSTAT) 0.4 MG SL tablet Place 1  tablet (0.4 mg total) under the tongue every 5 (five) minutes x 3 doses as needed for chest pain. 25 tablet 3  . spironolactone (ALDACTONE) 25 MG tablet TAKE 1 TABLET BY MOUTH EVERY DAY 90 tablet 2  . tamsulosin (FLOMAX) 0.4 MG CAPS capsule Take 0.4 mg by mouth every evening.  11   No current facility-administered medications for this visit.    Allergies:   Review of patient's allergies indicates no known allergies.   Social History:  The patient  reports that he has  never smoked. He does not have any smokeless tobacco history on file. He reports that he does not drink alcohol or use illicit drugs.   Family History:  The patient's family history includes Cancer in his maternal aunt, maternal aunt, and mother; Diabetes in his mother; Emphysema in his father.    ROS:  Please see the history of present illness.  Otherwise, review of systems is positive for easy bruising.  All other systems are reviewed and negative.    PHYSICAL EXAM: VS:  BP 118/62 mmHg  Pulse 56  Ht 5\' 10"  (1.778 m)  Wt 171 lb (77.565 kg)  BMI 24.54 kg/m2 , BMI Body mass index is 24.54 kg/(m^2). GEN: Well nourished, well developed, in no acute distress HEENT: normal Neck: no JVD, no masses. No carotid bruits Cardiac: RRR without murmur or gallop                Respiratory:  clear to auscultation bilaterally, normal work of breathing GI: soft, nontender, nondistended, + BS MS: no deformity or atrophy Ext: no pretibial edema, pedal pulses 2+= bilaterally Skin: warm and dry, no rash Neuro:  Strength and sensation are intact Psych: euthymic mood, full affect  EKG:  EKG is ordered today. The ekg ordered today shows NSR 56 bpm, LBBB  Recent Labs: No results found for requested labs within last 365 days.   Lipid Panel     Component Value Date/Time   CHOL 166 12/23/2013 0805   TRIG 103.0 12/23/2013 0805   HDL 38.10* 12/23/2013 0805   CHOLHDL 4 12/23/2013 0805   VLDL 20.6 12/23/2013 0805   LDLCALC 107* 12/23/2013 0805      Wt Readings from Last 3 Encounters:  03/20/15 171 lb (77.565 kg)  03/20/14 170 lb 12.8 oz (77.474 kg)  09/18/13 164 lb (74.39 kg)     Cardiac Studies Reviewed: 2D Echo 12-12-2012: Study Conclusions  - Left ventricle: The cavity size was normal. Wall thickness was increased in a pattern of mild LVH. Systolic function was mildly reduced. The estimated ejection fraction was in the range of 45% to 50%. There was an increased  relative contribution of atrial contraction to ventricular filling. - Right ventricle: Systolic function was mildly reduced. - Pulmonary arteries: Systolic pressure was mildly increased. PA peak pressure: 67mm Hg (S).  ASSESSMENT AND PLAN: 1. CAD, native vessel, with old MI: doing well without angina. Med Rx reviewed and he is on a good program. No changes made today.   2. Essential hypertension: BP well-controlled on current Rx.   3. Hyperlipidemia: on atorvastatin at high-dose. Labs followed by Dr Delfina Redwood.   4. LBBB - new finding from last year. No cardiac symptoms. As this can be found in cardiomyopathy and patient has hx of anterior infarct, recommend updated 2D Echo.  5. Possible shoulder surgery/preop cardiac eval: as long as echo shows stable LV function, he would be at low cardiac risk of surgery. He is asymptomatic at > 4  mets on stable medications.   Current medicines are reviewed with the patient today.  The patient does not have concerns regarding medicines.  Labs/ tests ordered today include:   Orders Placed This Encounter  Procedures  . EKG 12-Lead  . Echocardiogram   Disposition:   FU one year  Signed, [name], MD  03/21/2015 12:11 AM    [address], [place], [place]  [address] Phone: [phone]; Fax: [phone]

## 2015-04-06 ENCOUNTER — Ambulatory Visit (HOSPITAL_COMMUNITY): Payer: Medicare Other | Attending: Cardiovascular Disease

## 2015-04-06 ENCOUNTER — Other Ambulatory Visit: Payer: Self-pay

## 2015-04-06 ENCOUNTER — Other Ambulatory Visit: Payer: Self-pay | Admitting: *Deleted

## 2015-04-06 DIAGNOSIS — I2510 Atherosclerotic heart disease of native coronary artery without angina pectoris: Secondary | ICD-10-CM | POA: Diagnosis not present

## 2015-04-06 DIAGNOSIS — I110 Hypertensive heart disease with heart failure: Secondary | ICD-10-CM | POA: Diagnosis not present

## 2015-04-06 DIAGNOSIS — I255 Ischemic cardiomyopathy: Secondary | ICD-10-CM | POA: Insufficient documentation

## 2015-04-06 DIAGNOSIS — E785 Hyperlipidemia, unspecified: Secondary | ICD-10-CM | POA: Diagnosis not present

## 2015-04-06 DIAGNOSIS — I509 Heart failure, unspecified: Secondary | ICD-10-CM | POA: Diagnosis not present

## 2015-04-06 DIAGNOSIS — I447 Left bundle-branch block, unspecified: Secondary | ICD-10-CM

## 2015-04-06 MED ORDER — SPIRONOLACTONE 25 MG PO TABS: 25.0000 mg | ORAL_TABLET | Freq: Every day | ORAL | Status: DC

## 2015-04-06 MED ORDER — CARVEDILOL 3.125 MG PO TABS: ORAL_TABLET | ORAL | Status: DC

## 2015-04-07 ENCOUNTER — Other Ambulatory Visit: Payer: Self-pay

## 2015-04-07 MED ORDER — ATORVASTATIN CALCIUM 80 MG PO TABS: ORAL_TABLET | ORAL | Status: DC

## 2015-04-13 ENCOUNTER — Other Ambulatory Visit: Payer: Self-pay

## 2015-04-13 DIAGNOSIS — I2510 Atherosclerotic heart disease of native coronary artery without angina pectoris: Secondary | ICD-10-CM

## 2015-04-13 MED ORDER — NITROGLYCERIN 0.4 MG SL SUBL: 0.4000 mg | SUBLINGUAL_TABLET | SUBLINGUAL | Status: DC | PRN

## 2015-04-28 ENCOUNTER — Other Ambulatory Visit: Payer: Self-pay

## 2015-05-12 DIAGNOSIS — L570 Actinic keratosis: Secondary | ICD-10-CM | POA: Diagnosis not present

## 2015-05-12 DIAGNOSIS — X32XXXD Exposure to sunlight, subsequent encounter: Secondary | ICD-10-CM | POA: Diagnosis not present

## 2015-05-29 ENCOUNTER — Other Ambulatory Visit: Payer: Self-pay

## 2015-05-29 MED ORDER — CARVEDILOL 6.25 MG PO TABS: 6.2500 mg | ORAL_TABLET | Freq: Two times a day (BID) | ORAL | Status: DC

## 2015-06-25 DIAGNOSIS — R0981 Nasal congestion: Secondary | ICD-10-CM | POA: Diagnosis not present

## 2015-07-15 ENCOUNTER — Other Ambulatory Visit: Payer: Self-pay

## 2015-07-15 MED ORDER — CARVEDILOL 6.25 MG PO TABS: 6.2500 mg | ORAL_TABLET | Freq: Two times a day (BID) | ORAL | Status: DC

## 2015-08-11 DIAGNOSIS — Z0000 Encounter for general adult medical examination without abnormal findings: Secondary | ICD-10-CM | POA: Diagnosis not present

## 2015-08-11 DIAGNOSIS — I10 Essential (primary) hypertension: Secondary | ICD-10-CM | POA: Diagnosis not present

## 2015-08-11 DIAGNOSIS — Z1389 Encounter for screening for other disorder: Secondary | ICD-10-CM | POA: Diagnosis not present

## 2015-08-11 DIAGNOSIS — J329 Chronic sinusitis, unspecified: Secondary | ICD-10-CM | POA: Diagnosis not present

## 2015-08-11 DIAGNOSIS — I2510 Atherosclerotic heart disease of native coronary artery without angina pectoris: Secondary | ICD-10-CM | POA: Diagnosis not present

## 2015-08-11 DIAGNOSIS — E7800 Pure hypercholesterolemia, unspecified: Secondary | ICD-10-CM | POA: Diagnosis not present

## 2015-11-23 ENCOUNTER — Other Ambulatory Visit: Payer: Self-pay | Admitting: Cardiovascular Disease

## 2015-12-21 DIAGNOSIS — R351 Nocturia: Secondary | ICD-10-CM | POA: Diagnosis not present

## 2015-12-21 DIAGNOSIS — N401 Enlarged prostate with lower urinary tract symptoms: Secondary | ICD-10-CM | POA: Diagnosis not present

## 2016-01-12 DIAGNOSIS — X32XXXD Exposure to sunlight, subsequent encounter: Secondary | ICD-10-CM | POA: Diagnosis not present

## 2016-01-12 DIAGNOSIS — L820 Inflamed seborrheic keratosis: Secondary | ICD-10-CM | POA: Diagnosis not present

## 2016-01-12 DIAGNOSIS — D225 Melanocytic nevi of trunk: Secondary | ICD-10-CM | POA: Diagnosis not present

## 2016-01-12 DIAGNOSIS — L570 Actinic keratosis: Secondary | ICD-10-CM | POA: Diagnosis not present

## 2016-02-02 ENCOUNTER — Other Ambulatory Visit: Payer: Self-pay | Admitting: Cardiovascular Disease

## 2016-02-22 DIAGNOSIS — R0981 Nasal congestion: Secondary | ICD-10-CM | POA: Diagnosis not present

## 2016-03-07 DIAGNOSIS — R0981 Nasal congestion: Secondary | ICD-10-CM | POA: Diagnosis not present

## 2016-03-22 ENCOUNTER — Other Ambulatory Visit: Payer: Self-pay | Admitting: Cardiovascular Disease

## 2016-03-24 DIAGNOSIS — J069 Acute upper respiratory infection, unspecified: Secondary | ICD-10-CM | POA: Diagnosis not present

## 2016-03-27 DIAGNOSIS — R05 Cough: Secondary | ICD-10-CM | POA: Diagnosis not present

## 2016-04-04 ENCOUNTER — Encounter: Payer: Self-pay | Admitting: Cardiovascular Disease

## 2016-04-18 ENCOUNTER — Encounter: Payer: Self-pay | Admitting: Cardiovascular Disease

## 2016-04-18 ENCOUNTER — Ambulatory Visit (INDEPENDENT_AMBULATORY_CARE_PROVIDER_SITE_OTHER): Payer: Medicare Other | Admitting: Cardiovascular Disease

## 2016-04-18 ENCOUNTER — Encounter (INDEPENDENT_AMBULATORY_CARE_PROVIDER_SITE_OTHER): Payer: Self-pay

## 2016-04-18 DIAGNOSIS — I2510 Atherosclerotic heart disease of native coronary artery without angina pectoris: Secondary | ICD-10-CM

## 2016-04-18 MED ORDER — NITROGLYCERIN 0.4 MG SL SUBL: 0.4000 mg | SUBLINGUAL_TABLET | SUBLINGUAL | 5 refills | Status: DC | PRN

## 2016-04-18 NOTE — Progress Notes (Signed)
Cardiology Office Note Date:  04/18/2016   ID:  Jesus Warner, DOB 1935-09-26, MRN [id number]  PCP:  [name], MD  Cardiologist:  [name], MD    Chief Complaint  Patient presents with  . left bundle branch block  . surgical clearance     History of Present Illness: Jesus Warner is a 81 y.o. male who presents for follow-up of coronary artery disease. The patient initially presented in 2014 with an anterior wall STEMI and he was treated with primary PCI using a drug-eluting stent in the LAD. He was noted to have 80% stenosis of a small left circumflex with medical therapy recommended. The patient initially had severe LV dysfunction, but his ejection fraction improved to the range of 45-50% by follow-up echocardiogram in December 2014. Other problems include hypertension and hyperlipidemia.  At the time of his evaluation one year ago, he was noted to have a left bundle branch block on EKG. An updated echocardiogram was done and this demonstrated moderate LV systolic dysfunction with LVEF 40-45%. He has tolerated medical therapy well his had no symptoms of congestive heart failure. He specifically denies chest pain, chest pressure, shortness of breath, leg swelling, heart palpitations, lightheadedness, or syncope.   Past Medical History:  Diagnosis Date  . BPH (benign prostatic hyperplasia)   . CAD (coronary artery disease)    a. Anterior STEMI (9/14) => s/p DES-mid LAD (LHC 09/06/12: LAD occluded, after reperfusion distal LAD 40%, oCFX 80%, RCA with minimal irregularities, EF 40% with distal anterior, apical and infra-apical AK. PCI: Xience (3 x 18 mm) DES to the mid LAD. CFX was a small vessel and medical therapy was planned)  . Chronic systolic CHF (congestive heart failure) ([place])   . HTN (hypertension)   . Hyperlipidemia   . Ischemic cardiomyopathy    a.  Echocardiogram 09/06/12: EF 25-30%, mid to apical anteroseptal and inferoseptal AK, apical lateral AK, mid to  apical anterior severe HK, true apex AK, grade 1 diastolic dysfunction, trivial MR, mildly reduced RVSF, PASP 38;   b. Echo (12/2012): Mild LVH, EF 45-50%, PASP 40  . Seasonal allergies     Past Surgical History:  Procedure Laterality Date  . CORONARY ANGIOPLASTY WITH STENT PLACEMENT  09/06/2012   total mid LAD occlusion s/p DES, 40% distal LAD, 80% ostial LCx (small vessel supplying single OM branch), widely patent RCA; akinesis of distal anterior, apical and inferoapical walls; EF 40%  . LEFT HEART CATHETERIZATION WITH CORONARY ANGIOGRAM N/A 09/06/2012   Procedure: LEFT HEART CATHETERIZATION WITH CORONARY ANGIOGRAM;  Surgeon: [name], MD;  Location: [facility] CATH LAB;  Service: Cardiovascular;  Laterality: N/A;  . PERCUTANEOUS CORONARY STENT INTERVENTION (PCI-S)  09/06/2012   Procedure: PERCUTANEOUS CORONARY STENT INTERVENTION (PCI-S);  Surgeon: [name], MD;  Location: [facility] CATH LAB;  Service: Cardiovascular;;    Current Outpatient Prescriptions  Medication Sig Dispense Refill  . aspirin EC 81 MG EC tablet Take 1 tablet (81 mg total) by mouth daily.    Marland Kitchen atorvastatin (LIPITOR) 80 MG tablet TAKE 1 TABLET BY MOUTH EVERY DAY AT 6PM 90 tablet 3  . carvedilol (COREG) 6.25 MG tablet TAKE 1 TABLET BY MOUTH 2 TIMES DAILY WITH A MEAL 180 tablet 1  . finasteride (PROSCAR) 5 MG tablet Take 1 tablet by mouth daily.  0  . losartan (COZAAR) 50 MG tablet Take 1 tablet (50 mg total) by mouth daily. *Please call and schedule a one year follow up appointment* 90 tablet 0  .  MULTIPLE VITAMIN PO Take 1 tablet by mouth daily. Pt states he only takes this once in a while    . nitroGLYCERIN (NITROSTAT) 0.4 MG SL tablet Place 1 tablet (0.4 mg total) under the tongue every 5 (five) minutes x 3 doses as needed for chest pain. 25 tablet 5  . spironolactone (ALDACTONE) 25 MG tablet take 1 tablet by mouth once daily 90 tablet 3  . tamsulosin (FLOMAX) 0.4 MG CAPS capsule Take 0.4 mg by mouth every evening.  11   No  current facility-administered medications for this visit.     Allergies:   Patient has no known allergies.   Social History:  The patient  reports that he has never smoked. He has never used smokeless tobacco. He reports that he does not drink alcohol or use drugs.   Family History:  The patient's  family history includes Cancer in his maternal aunt, maternal aunt, and mother; Diabetes in his mother; Emphysema in his father.    ROS:  Please see the history of present illness.  Otherwise, review of systems is positive for hiccups and right shoulder arthritis.  All other systems are reviewed and negative.    PHYSICAL EXAM: VS:  BP 120/60   Pulse 66   Ht 5\' 11"  (1.803 m)   Wt 169 lb 1.9 oz (76.7 kg)   BMI 23.59 kg/m  , BMI Body mass index is 23.59 kg/m. GEN: Well nourished, well developed, in no acute distress  HEENT: normal  Neck: no JVD, no masses. No carotid bruits Cardiac: RRR without murmur or gallop                Respiratory:  clear to auscultation bilaterally, normal work of breathing GI: soft, nontender, nondistended, + BS MS: no deformity or atrophy  Ext: no pretibial edema, pedal pulses 2+= bilaterally Skin: warm and dry, no rash Neuro:  Strength and sensation are intact Psych: euthymic mood, full affect  EKG:  EKG is ordered today. The ekg ordered today shows NSR 65 bpm, LBBB  Recent Labs: No results found for requested labs within last 8760 hours.   Lipid Panel     Component Value Date/Time   CHOL 166 12/23/2013 0805   TRIG 103.0 12/23/2013 0805   HDL 38.10 (L) 12/23/2013 0805   CHOLHDL 4 12/23/2013 0805   VLDL 20.6 12/23/2013 0805   LDLCALC 107 (H) 12/23/2013 0805      Wt Readings from Last 3 Encounters:  04/18/16 169 lb 1.9 oz (76.7 kg)  03/20/15 171 lb (77.6 kg)  03/20/14 170 lb 12.8 oz (77.5 kg)     Cardiac Studies Reviewed: 2D Echo 04-06-2015: Study Conclusions  - Left ventricle: The cavity size was normal. Systolic function was   mildly to  moderately reduced. The estimated ejection fraction was   in the range of 40% to 45%. Hypokinesis of the   mid-apicalanteroseptal and apical myocardium. Doppler parameters   are consistent with abnormal left ventricular relaxation (grade 1   diastolic dysfunction). - Pulmonary arteries: PA peak pressure: 32 mm Hg (S).  ASSESSMENT AND PLAN: 1.  CAD, native vessel, with old MI: The patient demonstrates continued stability with respect to his coronary artery disease. He has no symptoms of angina. He is on a good medical program which is carefully reviewed today. He will follow-up in one year.  2. Chronic systolic heart failure, New York Heart Association functional class I symptoms. The patient's echocardiogram from last year is reviewed with an LVEF of  40-45%. The patient has a left bundle branch block on EKG. He is on appropriate therapy with Spirinolactone, losartan, and carvedilol. No changes are made today.  3. Hyperlipidemia: Treated with atorvastatin. Labs are followed by his primary physician.  4. Preoperative cardiovascular evaluation. The patient continues to contemplate right shoulder replacement surgery. He is able to walk 7 or 8 laps around [employer] at brisk pace without any symptoms. He is at low cardiac risk of surgery if needed. He has no symptoms of angina, heart failure, or reduced functional capacity based on cardiovascular limitation.  Current medicines are reviewed with the patient today.  The patient does not have concerns regarding medicines.  Labs/ tests ordered today include:   Orders Placed This Encounter  Procedures  . EKG 12-Lead    Disposition:   FU one year  Signed, [name], MD  04/18/2016 1:55 PM    [facility] [place], [place], [place]   [address] Phone: [phone]; Fax: [phone]

## 2016-04-18 NOTE — Patient Instructions (Signed)

## 2016-04-21 ENCOUNTER — Other Ambulatory Visit: Payer: Self-pay | Admitting: Cardiovascular Disease

## 2016-05-15 ENCOUNTER — Other Ambulatory Visit: Payer: Self-pay | Admitting: Cardiovascular Disease

## 2016-05-18 ENCOUNTER — Other Ambulatory Visit: Payer: Self-pay | Admitting: Cardiovascular Disease

## 2016-05-18 MED ORDER — CARVEDILOL 6.25 MG PO TABS: ORAL_TABLET | ORAL | 3 refills | Status: DC

## 2016-08-01 ENCOUNTER — Other Ambulatory Visit: Payer: Self-pay

## 2016-08-01 MED ORDER — LOSARTAN POTASSIUM 50 MG PO TABS: 50.0000 mg | ORAL_TABLET | Freq: Every day | ORAL | 2 refills | Status: DC

## 2016-09-09 DIAGNOSIS — E7800 Pure hypercholesterolemia, unspecified: Secondary | ICD-10-CM | POA: Diagnosis not present

## 2016-09-09 DIAGNOSIS — Z0000 Encounter for general adult medical examination without abnormal findings: Secondary | ICD-10-CM | POA: Diagnosis not present

## 2016-09-09 DIAGNOSIS — Z1389 Encounter for screening for other disorder: Secondary | ICD-10-CM | POA: Diagnosis not present

## 2016-09-09 DIAGNOSIS — I2510 Atherosclerotic heart disease of native coronary artery without angina pectoris: Secondary | ICD-10-CM | POA: Diagnosis not present

## 2016-10-24 DIAGNOSIS — R946 Abnormal results of thyroid function studies: Secondary | ICD-10-CM | POA: Diagnosis not present

## 2016-11-29 DIAGNOSIS — X32XXXD Exposure to sunlight, subsequent encounter: Secondary | ICD-10-CM | POA: Diagnosis not present

## 2016-11-29 DIAGNOSIS — L570 Actinic keratosis: Secondary | ICD-10-CM | POA: Diagnosis not present

## 2016-12-02 DIAGNOSIS — M545 Low back pain: Secondary | ICD-10-CM | POA: Diagnosis not present

## 2016-12-30 DIAGNOSIS — R351 Nocturia: Secondary | ICD-10-CM | POA: Diagnosis not present

## 2016-12-30 DIAGNOSIS — N401 Enlarged prostate with lower urinary tract symptoms: Secondary | ICD-10-CM | POA: Diagnosis not present

## 2017-01-09 DIAGNOSIS — H2513 Age-related nuclear cataract, bilateral: Secondary | ICD-10-CM | POA: Diagnosis not present

## 2017-01-09 DIAGNOSIS — H25013 Cortical age-related cataract, bilateral: Secondary | ICD-10-CM | POA: Diagnosis not present

## 2017-01-09 DIAGNOSIS — H43813 Vitreous degeneration, bilateral: Secondary | ICD-10-CM | POA: Diagnosis not present

## 2017-01-09 DIAGNOSIS — H40013 Open angle with borderline findings, low risk, bilateral: Secondary | ICD-10-CM | POA: Diagnosis not present

## 2017-02-23 DIAGNOSIS — N400 Enlarged prostate without lower urinary tract symptoms: Secondary | ICD-10-CM | POA: Diagnosis not present

## 2017-02-23 DIAGNOSIS — E785 Hyperlipidemia, unspecified: Secondary | ICD-10-CM | POA: Diagnosis not present

## 2017-02-23 DIAGNOSIS — I10 Essential (primary) hypertension: Secondary | ICD-10-CM | POA: Diagnosis not present

## 2017-02-23 DIAGNOSIS — I2510 Atherosclerotic heart disease of native coronary artery without angina pectoris: Secondary | ICD-10-CM | POA: Diagnosis not present

## 2017-03-17 ENCOUNTER — Other Ambulatory Visit: Payer: Self-pay

## 2017-03-17 ENCOUNTER — Other Ambulatory Visit: Payer: Self-pay | Admitting: Cardiovascular Disease

## 2017-03-17 MED ORDER — SPIRONOLACTONE 25 MG PO TABS: 25.0000 mg | ORAL_TABLET | Freq: Every day | ORAL | 0 refills | Status: DC

## 2017-03-20 DIAGNOSIS — I10 Essential (primary) hypertension: Secondary | ICD-10-CM | POA: Diagnosis not present

## 2017-03-20 DIAGNOSIS — E785 Hyperlipidemia, unspecified: Secondary | ICD-10-CM | POA: Diagnosis not present

## 2017-03-20 DIAGNOSIS — N400 Enlarged prostate without lower urinary tract symptoms: Secondary | ICD-10-CM | POA: Diagnosis not present

## 2017-03-20 DIAGNOSIS — I2510 Atherosclerotic heart disease of native coronary artery without angina pectoris: Secondary | ICD-10-CM | POA: Diagnosis not present

## 2017-03-30 ENCOUNTER — Telehealth: Payer: Self-pay | Admitting: Cardiovascular Disease

## 2017-03-30 NOTE — Telephone Encounter (Signed)
Close encounter 

## 2017-04-10 DIAGNOSIS — H40013 Open angle with borderline findings, low risk, bilateral: Secondary | ICD-10-CM | POA: Diagnosis not present

## 2017-04-10 DIAGNOSIS — H5340 Unspecified visual field defects: Secondary | ICD-10-CM | POA: Diagnosis not present

## 2017-04-14 ENCOUNTER — Other Ambulatory Visit: Payer: Self-pay | Admitting: Cardiovascular Disease

## 2017-04-17 DIAGNOSIS — H5340 Unspecified visual field defects: Secondary | ICD-10-CM | POA: Diagnosis not present

## 2017-04-17 DIAGNOSIS — M19011 Primary osteoarthritis, right shoulder: Secondary | ICD-10-CM | POA: Diagnosis not present

## 2017-04-18 ENCOUNTER — Telehealth: Payer: Self-pay | Admitting: *Deleted

## 2017-04-18 NOTE — Telephone Encounter (Signed)
   Primary [place], MD  Chart reviewed as part of pre-operative protocol coverage. Because of RAKESH DUTKO past medical history and time since last visit, he/she will require a follow-up visit in order to better assess preoperative cardiovascular risk.  Pre-op covering staff: - Please schedule appointment and call patient to inform them. - Please contact requesting surgeon's office via preferred method (i.e, phone, fax) to inform them of need for appointment prior to surgery.  Rosaria Ferries, PA-C  04/18/2017, 5:33 PM

## 2017-04-18 NOTE — Telephone Encounter (Signed)
   [facility] Pre-operative Risk Assessment    Request for surgical clearance:  1. What type of surgery is being performed? Right Total Shoulder Arthroplasty   2. When is this surgery scheduled?  TBD   3. What type of clearance is required (medical clearance vs. Pharmacy clearance to hold med vs. Both)? Medical  4. Are there any medications that need to be held prior to surgery and how long? None noted  5. Practice name and name of physician performing surgery? [place].  Dr. Tamera Punt    6. What is your office phone [phone].  Phone number for surgical coordinator is [phone] Tammi Sou)   7.   What is your office fax number [phone]  8.   Anesthesia type (None, local, MAC, general) ? Choice     _________________________________________________________________   (provider comments below)

## 2017-04-19 NOTE — Telephone Encounter (Signed)
S/w pt is coming in tomorrow for ov with Vin to discuss upcoming surgery.

## 2017-04-20 ENCOUNTER — Encounter: Payer: Self-pay | Admitting: Physician Assistant

## 2017-04-20 ENCOUNTER — Ambulatory Visit (INDEPENDENT_AMBULATORY_CARE_PROVIDER_SITE_OTHER): Payer: Medicare Other | Admitting: Physician Assistant

## 2017-04-20 VITALS — BP 106/72 | HR 62 | Ht 70.5 in | Wt 170.6 lb

## 2017-04-20 DIAGNOSIS — I5022 Chronic systolic (congestive) heart failure: Secondary | ICD-10-CM | POA: Diagnosis not present

## 2017-04-20 DIAGNOSIS — Z01818 Encounter for other preprocedural examination: Secondary | ICD-10-CM

## 2017-04-20 DIAGNOSIS — I10 Essential (primary) hypertension: Secondary | ICD-10-CM | POA: Diagnosis not present

## 2017-04-20 DIAGNOSIS — I2510 Atherosclerotic heart disease of native coronary artery without angina pectoris: Secondary | ICD-10-CM

## 2017-04-20 DIAGNOSIS — E785 Hyperlipidemia, unspecified: Secondary | ICD-10-CM | POA: Diagnosis not present

## 2017-04-20 NOTE — Progress Notes (Signed)
Cardiology Office Note    Date:  04/20/2017   ID:  Jesus Warner, DOB [birth year], MRN [id number]  PCP:  Seward Carol, MD  Cardiologist: Dr. Burt Knack   Chief Complaint: Surgical clearance for Right Total Shoulder Arthroplasty   History of Present Illness:   Jesus Warner is a 82 y.o. male with hx of CAD, chronic systolic CHF, chronic LBBB,  HTN and HLD presents for cardiac clearance.  The patient initially presented in 2014 with an anterior wall STEMI and he was treated with primary PCI using a drug-eluting stent in the LAD. He was noted to have 80% stenosis of a small left circumflex with medical therapy recommended. The patient initially had severe LV dysfunction, but his ejection fraction improved to the range of 45-50% by follow-up echocardiogram in December 2014.   He was doing well on cardiac stand point when last seen by Dr. Burt Knack 04/18/16. He was cleared for surgery however delayed until now.   He still walks 5-7 laps around [place] multiple times/week without angina or dyspnea. The patient denies nausea, vomiting, fever, chest pain, palpitations, shortness of breath, orthopnea, PND, dizziness, syncope, cough, congestion, abdominal pain, hematochezia, melena, lower extremity edema. Compliant with medications.    Past Medical History:  Diagnosis Date  . BPH (benign prostatic hyperplasia)   . CAD (coronary artery disease)    a. Anterior STEMI (9/14) => s/p DES-mid LAD (LHC 09/06/12: LAD occluded, after reperfusion distal LAD 40%, oCFX 80%, RCA with minimal irregularities, EF 40% with distal anterior, apical and infra-apical AK. PCI: Xience (3 x 18 mm) DES to the mid LAD. CFX was a small vessel and medical therapy was planned)  . Chronic systolic CHF (congestive heart failure) ([place])   . HTN (hypertension)   . Hyperlipidemia   . Ischemic cardiomyopathy    a.  Echocardiogram 09/06/12: EF 25-30%, mid to apical anteroseptal and inferoseptal AK, apical lateral AK, mid to apical  anterior severe HK, true apex AK, grade 1 diastolic dysfunction, trivial MR, mildly reduced RVSF, PASP 38;   b. Echo (12/2012): Mild LVH, EF 45-50%, PASP 40  . Seasonal allergies     Past Surgical History:  Procedure Laterality Date  . CORONARY ANGIOPLASTY WITH STENT PLACEMENT  09/06/2012   total mid LAD occlusion s/p DES, 40% distal LAD, 80% ostial LCx (small vessel supplying single OM branch), widely patent RCA; akinesis of distal anterior, apical and inferoapical walls; EF 40%  . LEFT HEART CATHETERIZATION WITH CORONARY ANGIOGRAM N/A 09/06/2012   Procedure: LEFT HEART CATHETERIZATION WITH CORONARY ANGIOGRAM;  Surgeon: [name], MD;  Location: [facility] CATH LAB;  Service: Cardiovascular;  Laterality: N/A;  . PERCUTANEOUS CORONARY STENT INTERVENTION (PCI-S)  09/06/2012   Procedure: PERCUTANEOUS CORONARY STENT INTERVENTION (PCI-S);  Surgeon: [name], MD;  Location: [facility] CATH LAB;  Service: Cardiovascular;;    Current Medications: Prior to Admission medications   Medication Sig Start Date End Date Taking? Authorizing Provider  aspirin EC 81 MG EC tablet Take 1 tablet (81 mg total) by mouth daily. 09/09/12   Arguello, Roger A, PA-C  atorvastatin (LIPITOR) 80 MG tablet TAKE 1 TABLET BY MOUTH EVERY DAY AT 6:00PM 05/16/16   [name], MD  carvedilol (COREG) 6.25 MG tablet TAKE 1 TABLET BY MOUTH 2 TIMES DAILY WITH A MEAL 05/18/16   [name], MD  finasteride (PROSCAR) 5 MG tablet Take 1 tablet by mouth daily. 02/28/15   [provider]  losartan (COZAAR) 50 MG tablet Take 1 tablet (50 mg total)  by mouth daily. 08/01/16   [name], MD  MULTIPLE VITAMIN PO Take 1 tablet by mouth daily. Pt states he only takes this once in a while    [provider]  nitroGLYCERIN (NITROSTAT) 0.4 MG SL tablet Place 1 tablet (0.4 mg total) under the tongue every 5 (five) minutes x 3 doses as needed for chest pain. 04/18/16   [name], MD  spironolactone (ALDACTONE) 25 MG tablet TAKE  1 TABLET BY MOUTH DAILY 04/14/17   [name], MD  tamsulosin [facility]) 0.4 MG CAPS capsule Take 0.4 mg by mouth every evening. 03/03/14   [provider]    Allergies:   Patient has no known allergies.   Social History   Socioeconomic History  . Marital status: Married    Spouse name: Not on file  . Number of children: Not on file  . Years of education: Not on file  . Highest education level: Not on file  Occupational History  . Not on file  Social Needs  . Financial resource strain: Not on file  . Food insecurity:    Worry: Not on file    Inability: Not on file  . Transportation needs:    Medical: Not on file    Non-medical: Not on file  Tobacco Use  . Smoking status: Never Smoker  . Smokeless tobacco: Never Used  Substance and Sexual Activity  . Alcohol use: No  . Drug use: No  . Sexual activity: Not on file  Lifestyle  . Physical activity:    Days per week: Not on file    Minutes per session: Not on file  . Stress: Not on file  Relationships  . Social connections:    Talks on phone: Not on file    Gets together: Not on file    Attends religious service: Not on file    Active member of club or organization: Not on file    Attends meetings of clubs or organizations: Not on file    Relationship status: Not on file  Other Topics Concern  . Not on file  Social History Narrative   Lives at home with wife in [place] History:  The patient's family history includes Cancer in his maternal aunt, maternal aunt, and mother; Diabetes in his mother; Emphysema in his father.   ROS:   Please see the history of present illness.    ROS All other systems reviewed and are negative.   PHYSICAL EXAM:   VS:  BP 106/72   Pulse 62   Ht 5' 10.5" (1.791 m)   Wt 170 lb 9.6 oz (77.4 kg)   SpO2 94%   BMI 24.13 kg/m    GEN: Well nourished, well developed, in no acute distress  HEENT: normal  Neck: no JVD, carotid bruits, or masses Cardiac: RRR; no  murmurs, rubs, or gallops,no edema  Respiratory:  clear to auscultation bilaterally, normal work of breathing GI: soft, nontender, nondistended, + BS MS: no deformity or atrophy  Skin: warm and dry, no rash Neuro:  Alert and Oriented x 3, Strength and sensation are intact Psych: euthymic mood, full affect  Wt Readings from Last 3 Encounters:  04/20/17 170 lb 9.6 oz (77.4 kg)  04/18/16 169 lb 1.9 oz (76.7 kg)  03/20/15 171 lb (77.6 kg)      Studies/Labs Reviewed:   EKG:  EKG is ordered today.  The ekg ordered today demonstrates NSR with LBBB  Recent Labs: No results  found for requested labs within last 8760 hours.   Lipid Panel    Component Value Date/Time   CHOL 166 12/23/2013 0805   TRIG 103.0 12/23/2013 0805   HDL 38.10 (L) 12/23/2013 0805   CHOLHDL 4 12/23/2013 0805   VLDL 20.6 12/23/2013 0805   LDLCALC 107 (H) 12/23/2013 0805    Additional studies/ records that were reviewed today include:   2D Echo 04-06-2015: Study Conclusions  - Left ventricle: The cavity size was normal. Systolic function was mildly to moderately reduced. The estimated ejection fraction was in the range of 40% to 45%. Hypokinesis of the mid-apicalanteroseptal and apical myocardium. Doppler parameters are consistent with abnormal left ventricular relaxation (grade 1 diastolic dysfunction). - Pulmonary arteries: PA peak pressure: 32 mm Hg (S).    ASSESSMENT & PLAN:    1. Pre-op clearance - He continues to walk at [facility] without any limitation.                                             Given past medical history and based on ACC/AHA guidelines, SALEM LEMBKE would be at acceptable risk for the planned procedure without further cardiovascular testing. May hold ASA 7 days prior to surgery if needed.   I will route this recommendation to the requesting party via Epic fax function and remove from pre-op pool.  2. CAD - No angina. Continue ASA and statin.   3. HTN - STable  and well controlled.   4. HLD - Continue statin. Followed by PCP.  5. Chronic systolic CHF - Euvolemic. Continue BB, Spironolactone and ARB.   Medication Adjustments/Labs and Tests Ordered: Current medicines are reviewed at length with the patient today.  Concerns regarding medicines are outlined above.  Medication changes, Labs and Tests ordered today are listed in the Patient Instructions below. Patient Instructions  Medication Instructions:  Your physician recommends that you continue on your current medications as directed. Please refer to the Current Medication list given to you today.  Labwork: None  Testing/Procedures: None  Follow-Up: Your physician wants you to follow-up in: 1 year with Dr. Burt Knack.  You will receive a reminder letter in the mail two months in advance. If you don't receive a letter, please call our office to schedule the follow-up appointment.   Any Other Special Instructions Will Be Listed Below (If Applicable).     If you need a refill on your cardiac medications before your next appointment, please call your pharmacy.      Jarrett Soho, [place]  04/20/2017 1:49 PM    [facility] [place], [place], [place]  [address] Phone: [phone]; Fax: [phone]

## 2017-04-20 NOTE — Patient Instructions (Signed)
Medication Instructions:  Your physician recommends that you continue on your current medications as directed. Please refer to the Current Medication list given to you today.  Labwork: None  Testing/Procedures: None  Follow-Up: Your physician wants you to follow-up in: 1 year with Dr. Cooper.  You will receive a reminder letter in the mail two months in advance. If you don't receive a letter, please call our office to schedule the follow-up appointment.   Any Other Special Instructions Will Be Listed Below (If Applicable).     If you need a refill on your cardiac medications before your next appointment, please call your pharmacy.   

## 2017-04-28 ENCOUNTER — Other Ambulatory Visit: Payer: Self-pay | Admitting: Cardiovascular Disease

## 2017-05-01 ENCOUNTER — Other Ambulatory Visit: Payer: Self-pay | Admitting: Orthopedic Surgery

## 2017-05-08 ENCOUNTER — Other Ambulatory Visit: Payer: Self-pay | Admitting: Cardiovascular Disease

## 2017-05-08 DIAGNOSIS — M25511 Pain in right shoulder: Secondary | ICD-10-CM | POA: Diagnosis not present

## 2017-05-10 ENCOUNTER — Other Ambulatory Visit: Payer: Self-pay | Admitting: Orthopedic Surgery

## 2017-05-10 DIAGNOSIS — M19011 Primary osteoarthritis, right shoulder: Secondary | ICD-10-CM

## 2017-05-15 NOTE — Pre-Procedure Instructions (Signed)
Tarquin Welcher [facility]  05/15/2017      [facility], [place] [address] Phone: [phone] Fax: [phone]  [facility] [place], [place] - [address] AT [facility] [address] [place] [address] Phone: [phone] Fax: [phone]    Your procedure is scheduled on  Thursday 05/25/17  Report to [facility] Admitting at 530 A.M.  Call this number if you have problems the morning of surgery:  [phone]   Remember:  Do not eat food or drink liquids after midnight.  Take these medicines the morning of surgery with A SIP OF WATER - TYLENOL IF NEEDED, CARVEDILOL (COREG), FINASTERIDE (PROSCAR), XYZAL, RANITIDINE (ZANTAC), TAMSULOSIN (FLOMAX)  7 days prior to surgery STOP taking any Aspirin(unless otherwise instructed by your surgeon), Aleve, Naproxen, Ibuprofen, Motrin, Advil, Goody's, BC's, all herbal medications, fish oil, and all vitamins   Do not wear jewelry, make-up or nail polish.  Do not wear lotions, powders, or perfumes, or deodorant.  Do not shave 48 hours prior to surgery.  Men may shave face and neck.  Do not bring valuables to the hospital.  [facility] is not responsible for any belongings or valuables.  Contacts, dentures or bridgework may not be worn into surgery.  Leave your suitcase in the car.  After surgery it may be brought to your room.  For patients admitted to the hospital, discharge time will be determined by your treatment team.  Patients discharged the day of surgery will not be allowed to drive home.   Name and phone number of your driver:    Special instructions:  [place] - Preparing for Surgery  Before surgery, you can play an important role.  Because skin is not sterile, your skin needs to be as free of germs as possible.  You can reduce the number of germs on you skin by washing with CHG  (chlorahexidine gluconate) soap before surgery.  CHG is an antiseptic cleaner which kills germs and bonds with the skin to continue killing germs even after washing.  Please DO NOT use if you have an allergy to CHG or antibacterial soaps.  If your skin becomes reddened/irritated stop using the CHG and inform your nurse when you arrive at Short Stay.  Do not shave (including legs and underarms) for at least 48 hours prior to the first CHG shower.  You may shave your face.  Please follow these instructions carefully:   1.  Shower with CHG Soap the night before surgery and the                                morning of Surgery.  2.  If you choose to wash your hair, wash your hair first as usual with your       normal shampoo.  3.  After you shampoo, rinse your hair and body thoroughly to remove the                      Shampoo.  4.  Use CHG as you would any other liquid soap.  You can apply chg directly       to the skin and wash gently with scrungie or a clean washcloth.  5.  Apply the CHG Soap to your body ONLY FROM  THE NECK DOWN.        Do not use on open wounds or open sores.  Avoid contact with your eyes,       ears, mouth and genitals (private parts).  Wash genitals (private parts)       with your normal soap.  6.  Wash thoroughly, paying special attention to the area where your surgery        will be performed.  7.  Thoroughly rinse your body with warm water from the neck down.  8.  DO NOT shower/wash with your normal soap after using and rinsing off       the CHG Soap.  9.  Pat yourself dry with a clean towel.            10.  Wear clean pajamas.            11.  Place clean sheets on your bed the night of your first shower and do not        sleep with pets.  Day of Surgery  Do not apply any lotions/deoderants the morning of surgery.  Please wear clean clothes to the hospital/surgery center.     Please read over the following fact sheets that you were given. MRSA Information and  Surgical Site Infection Prevention

## 2017-05-16 ENCOUNTER — Ambulatory Visit (HOSPITAL_COMMUNITY)
Admission: RE | Admit: 2017-05-16 | Discharge: 2017-05-16 | Disposition: A | Discharge status: Home / Self Care | Payer: Medicare Other | Source: Ambulatory Visit | Attending: Orthopedic Surgery | Admitting: Orthopedic Surgery

## 2017-05-16 ENCOUNTER — Ambulatory Visit
Admission: RE | Admit: 2017-05-16 | Discharge: 2017-05-16 | Disposition: A | Discharge status: Home / Self Care | Payer: Medicare Other | Source: Ambulatory Visit | Attending: Orthopedic Surgery | Admitting: Orthopedic Surgery

## 2017-05-16 ENCOUNTER — Other Ambulatory Visit: Payer: Self-pay

## 2017-05-16 ENCOUNTER — Encounter (HOSPITAL_COMMUNITY): Payer: Self-pay

## 2017-05-16 ENCOUNTER — Encounter (HOSPITAL_COMMUNITY)
Admission: RE | Admit: 2017-05-16 | Discharge: 2017-05-16 | Disposition: A | Discharge status: Home / Self Care | Payer: Medicare Other | Source: Ambulatory Visit | Attending: Orthopedic Surgery | Admitting: Orthopedic Surgery

## 2017-05-16 DIAGNOSIS — M19011 Primary osteoarthritis, right shoulder: Secondary | ICD-10-CM

## 2017-05-16 DIAGNOSIS — E785 Hyperlipidemia, unspecified: Secondary | ICD-10-CM | POA: Insufficient documentation

## 2017-05-16 DIAGNOSIS — I110 Hypertensive heart disease with heart failure: Secondary | ICD-10-CM | POA: Diagnosis not present

## 2017-05-16 DIAGNOSIS — Z01812 Encounter for preprocedural laboratory examination: Secondary | ICD-10-CM | POA: Diagnosis not present

## 2017-05-16 DIAGNOSIS — I2510 Atherosclerotic heart disease of native coronary artery without angina pectoris: Secondary | ICD-10-CM | POA: Diagnosis not present

## 2017-05-16 DIAGNOSIS — I5022 Chronic systolic (congestive) heart failure: Secondary | ICD-10-CM | POA: Diagnosis not present

## 2017-05-16 DIAGNOSIS — I447 Left bundle-branch block, unspecified: Secondary | ICD-10-CM | POA: Insufficient documentation

## 2017-05-16 DIAGNOSIS — Z79899 Other long term (current) drug therapy: Secondary | ICD-10-CM | POA: Diagnosis not present

## 2017-05-16 DIAGNOSIS — Z7982 Long term (current) use of aspirin: Secondary | ICD-10-CM | POA: Insufficient documentation

## 2017-05-16 DIAGNOSIS — Z01818 Encounter for other preprocedural examination: Secondary | ICD-10-CM | POA: Diagnosis present

## 2017-05-16 DIAGNOSIS — J984 Other disorders of lung: Secondary | ICD-10-CM | POA: Diagnosis not present

## 2017-05-16 HISTORY — DX: Unspecified osteoarthritis, unspecified site: M19.90

## 2017-05-16 HISTORY — DX: Primary osteoarthritis, unspecified shoulder: M19.019

## 2017-05-16 HISTORY — DX: Pneumonia, unspecified organism: J18.9

## 2017-05-16 LAB — CBC WITH DIFFERENTIAL/PLATELET
Abs Immature Granulocytes: 0 10*3/uL (ref 0.0–0.1)
Basophils Absolute: 0 10*3/uL (ref 0.0–0.1)
Basophils Relative: 1 %
EOS ABS: 0.3 10*3/uL (ref 0.0–0.7)
EOS PCT: 5 %
HEMATOCRIT: 42.5 % (ref 39.0–52.0)
HEMOGLOBIN: 14 g/dL (ref 13.0–17.0)
IMMATURE GRANULOCYTES: 0 %
LYMPHS ABS: 0.9 10*3/uL (ref 0.7–4.0)
LYMPHS PCT: 14 %
MCH: 31.7 pg (ref 26.0–34.0)
MCHC: 32.9 g/dL (ref 30.0–36.0)
MCV: 96.2 fL (ref 78.0–100.0)
MONOS PCT: 10 %
Monocytes Absolute: 0.7 10*3/uL (ref 0.1–1.0)
Neutro Abs: 4.9 10*3/uL (ref 1.7–7.7)
Neutrophils Relative %: 70 %
Platelets: 178 10*3/uL (ref 150–400)
RBC: 4.42 MIL/uL (ref 4.22–5.81)
RDW: 12.8 % (ref 11.5–15.5)
WBC: 6.9 10*3/uL (ref 4.0–10.5)

## 2017-05-16 LAB — COMPREHENSIVE METABOLIC PANEL
ALBUMIN: 3.9 g/dL (ref 3.5–5.0)
ALK PHOS: 105 U/L (ref 38–126)
ALT: 20 U/L (ref 17–63)
AST: 26 U/L (ref 15–41)
Anion gap: 9 (ref 5–15)
BILIRUBIN TOTAL: 1 mg/dL (ref 0.3–1.2)
BUN: 15 mg/dL (ref 6–20)
CALCIUM: 8.9 mg/dL (ref 8.9–10.3)
CO2: 23 mmol/L (ref 22–32)
Chloride: 108 mmol/L (ref 101–111)
Creatinine, Ser: 1.5 mg/dL — ABNORMAL HIGH (ref 0.61–1.24)
GFR calc Af Amer: 49 mL/min — ABNORMAL LOW (ref >60)
GFR, EST NON AFRICAN AMERICAN: 42 mL/min — AB (ref >60)
GLUCOSE: 90 mg/dL (ref 65–99)
Potassium: 4.3 mmol/L (ref 3.5–5.1)
Sodium: 140 mmol/L (ref 135–145)
TOTAL PROTEIN: 6.9 g/dL (ref 6.5–8.1)

## 2017-05-16 LAB — URINALYSIS, ROUTINE W REFLEX MICROSCOPIC
BILIRUBIN URINE: NEGATIVE
Glucose, UA: NEGATIVE mg/dL
Hgb urine dipstick: NEGATIVE
Ketones, ur: NEGATIVE mg/dL
Leukocytes, UA: NEGATIVE
NITRITE: NEGATIVE
Protein, ur: NEGATIVE mg/dL
SPECIFIC GRAVITY, URINE: 1.015 (ref 1.005–1.030)
pH: 5 (ref 5.0–8.0)

## 2017-05-16 LAB — TYPE AND SCREEN
ABO/RH(D): O POS
Antibody Screen: NEGATIVE

## 2017-05-16 LAB — APTT: aPTT: 28 seconds (ref 24–36)

## 2017-05-16 LAB — PROTIME-INR
INR: 1.06
PROTHROMBIN TIME: 13.7 s (ref 11.4–15.2)

## 2017-05-16 LAB — ABO/RH: ABO/RH(D): O POS

## 2017-05-16 LAB — SURGICAL PCR SCREEN
MRSA, PCR: NEGATIVE
STAPHYLOCOCCUS AUREUS: NEGATIVE

## 2017-05-16 NOTE — Progress Notes (Signed)
PCP - Dr. Delfina Redwood  Cardiologist - Dr. Burt Knack PA Bhagat- clearance note 4/18  Chest x-ray - 05/16/17  EKG - 04/20/17 (E)  Stress Test - Denies  ECHO - 04/06/15 (E)  Cardiac Cath - 09/06/12 (E)  Sleep Study - Denies CPAP - None  LABS- 05/16/17: CBC w/D, CMP, PT, PTT, T/S, UA  ASA- LD- 5/16  Anesthesia- No  Pt denies having chest pain, sob, or fever at this time. All instructions explained to the pt, with a verbal understanding of the material. Pt agrees to go over the instructions while at home for a better understanding. The opportunity to ask questions was provided.

## 2017-05-17 DIAGNOSIS — L255 Unspecified contact dermatitis due to plants, except food: Secondary | ICD-10-CM | POA: Diagnosis not present

## 2017-05-25 ENCOUNTER — Other Ambulatory Visit: Payer: Self-pay

## 2017-05-25 ENCOUNTER — Inpatient Hospital Stay (HOSPITAL_COMMUNITY): Payer: Medicare Other

## 2017-05-25 ENCOUNTER — Inpatient Hospital Stay (HOSPITAL_COMMUNITY): Payer: Medicare Other | Admitting: Certified Registered"

## 2017-05-25 ENCOUNTER — Inpatient Hospital Stay (HOSPITAL_COMMUNITY)
Admission: RE | Admit: 2017-05-25 | Discharge: 2017-05-26 | DRG: 483 | Disposition: A | Discharge status: Home / Self Care | Payer: Medicare Other | Source: Ambulatory Visit | Attending: Orthopedic Surgery | Admitting: Orthopedic Surgery

## 2017-05-25 ENCOUNTER — Encounter (HOSPITAL_COMMUNITY): Payer: Self-pay | Admitting: General Practice

## 2017-05-25 ENCOUNTER — Encounter (HOSPITAL_COMMUNITY)
Admission: RE | Disposition: A | Discharge status: Home / Self Care | Payer: Self-pay | Source: Ambulatory Visit | Attending: Orthopedic Surgery

## 2017-05-25 DIAGNOSIS — E785 Hyperlipidemia, unspecified: Secondary | ICD-10-CM | POA: Diagnosis present

## 2017-05-25 DIAGNOSIS — I255 Ischemic cardiomyopathy: Secondary | ICD-10-CM | POA: Diagnosis not present

## 2017-05-25 DIAGNOSIS — I2510 Atherosclerotic heart disease of native coronary artery without angina pectoris: Secondary | ICD-10-CM | POA: Diagnosis not present

## 2017-05-25 DIAGNOSIS — I252 Old myocardial infarction: Secondary | ICD-10-CM | POA: Diagnosis not present

## 2017-05-25 DIAGNOSIS — Z96611 Presence of right artificial shoulder joint: Secondary | ICD-10-CM

## 2017-05-25 DIAGNOSIS — I110 Hypertensive heart disease with heart failure: Secondary | ICD-10-CM | POA: Diagnosis not present

## 2017-05-25 DIAGNOSIS — J302 Other seasonal allergic rhinitis: Secondary | ICD-10-CM | POA: Diagnosis present

## 2017-05-25 DIAGNOSIS — M7521 Bicipital tendinitis, right shoulder: Secondary | ICD-10-CM | POA: Diagnosis not present

## 2017-05-25 DIAGNOSIS — Z79899 Other long term (current) drug therapy: Secondary | ICD-10-CM

## 2017-05-25 DIAGNOSIS — M19011 Primary osteoarthritis, right shoulder: Principal | ICD-10-CM | POA: Diagnosis present

## 2017-05-25 DIAGNOSIS — Z955 Presence of coronary angioplasty implant and graft: Secondary | ICD-10-CM | POA: Diagnosis not present

## 2017-05-25 DIAGNOSIS — Z7982 Long term (current) use of aspirin: Secondary | ICD-10-CM

## 2017-05-25 DIAGNOSIS — M25711 Osteophyte, right shoulder: Secondary | ICD-10-CM | POA: Diagnosis present

## 2017-05-25 DIAGNOSIS — Z833 Family history of diabetes mellitus: Secondary | ICD-10-CM | POA: Diagnosis not present

## 2017-05-25 DIAGNOSIS — I5022 Chronic systolic (congestive) heart failure: Secondary | ICD-10-CM | POA: Diagnosis not present

## 2017-05-25 DIAGNOSIS — G8918 Other acute postprocedural pain: Secondary | ICD-10-CM | POA: Diagnosis not present

## 2017-05-25 DIAGNOSIS — Z171 Estrogen receptor negative status [ER-]: Secondary | ICD-10-CM | POA: Diagnosis not present

## 2017-05-25 DIAGNOSIS — N400 Enlarged prostate without lower urinary tract symptoms: Secondary | ICD-10-CM | POA: Diagnosis present

## 2017-05-25 DIAGNOSIS — Z825 Family history of asthma and other chronic lower respiratory diseases: Secondary | ICD-10-CM | POA: Diagnosis not present

## 2017-05-25 HISTORY — PX: TOTAL SHOULDER ARTHROPLASTY: SHX126

## 2017-05-25 SURGERY — ARTHROPLASTY, SHOULDER, TOTAL
Anesthesia: General | Site: Shoulder | Laterality: Right

## 2017-05-25 MED ORDER — OXYCODONE-ACETAMINOPHEN 5-325 MG PO TABS: 1.0000 | ORAL_TABLET | ORAL | 0 refills | Status: DC | PRN

## 2017-05-25 MED ORDER — SODIUM CHLORIDE 0.9 % IV SOLN: INTRAVENOUS | Status: DC

## 2017-05-25 MED ORDER — METHOCARBAMOL 500 MG PO TABS: 500.0000 mg | ORAL_TABLET | Freq: Four times a day (QID) | ORAL | Status: DC | PRN

## 2017-05-25 MED ORDER — OXYCODONE HCL 5 MG PO TABS
5.0000 mg | ORAL_TABLET | ORAL | Status: DC | PRN
  Administered 2017-05-25 – 2017-05-26 (×2): 10 mg via ORAL
  Filled 2017-05-25: qty 2

## 2017-05-25 MED ORDER — PROPOFOL 10 MG/ML IV BOLUS
INTRAVENOUS | Status: AC
  Filled 2017-05-25: qty 20

## 2017-05-25 MED ORDER — 0.9 % SODIUM CHLORIDE (POUR BTL) OPTIME
TOPICAL | Status: DC | PRN
Start: 2017-05-25 — End: 2017-05-25
  Administered 2017-05-25: 1000 mL

## 2017-05-25 MED ORDER — LIDOCAINE 2% (20 MG/ML) 5 ML SYRINGE
INTRAMUSCULAR | Status: DC | PRN
  Administered 2017-05-25: 50 mg via INTRAVENOUS

## 2017-05-25 MED ORDER — LACTATED RINGERS IV SOLN
INTRAVENOUS | Status: DC | PRN
  Administered 2017-05-25: 07:00:00 via INTRAVENOUS

## 2017-05-25 MED ORDER — HYDROMORPHONE HCL 2 MG/ML IJ SOLN
0.5000 mg | INTRAMUSCULAR | Status: DC | PRN
  Filled 2017-05-25: qty 1

## 2017-05-25 MED ORDER — EPHEDRINE SULFATE 50 MG/ML IJ SOLN
INTRAMUSCULAR | Status: AC
  Filled 2017-05-25: qty 1

## 2017-05-25 MED ORDER — FENTANYL CITRATE (PF) 100 MCG/2ML IJ SOLN
INTRAMUSCULAR | Status: DC | PRN
  Administered 2017-05-25: 50 ug via INTRAVENOUS
  Administered 2017-05-25: 25 ug via INTRAVENOUS

## 2017-05-25 MED ORDER — CARVEDILOL 6.25 MG PO TABS
6.2500 mg | ORAL_TABLET | Freq: Two times a day (BID) | ORAL | Status: DC
  Administered 2017-05-25 – 2017-05-26 (×2): 6.25 mg via ORAL
  Filled 2017-05-25 (×2): qty 1

## 2017-05-25 MED ORDER — CEFAZOLIN SODIUM-DEXTROSE 2-4 GM/100ML-% IV SOLN
INTRAVENOUS | Status: AC
  Filled 2017-05-25: qty 100

## 2017-05-25 MED ORDER — METOCLOPRAMIDE HCL 5 MG/ML IJ SOLN: 5.0000 mg | Freq: Three times a day (TID) | INTRAMUSCULAR | Status: DC | PRN

## 2017-05-25 MED ORDER — METHOCARBAMOL 1000 MG/10ML IJ SOLN: 500.0000 mg | Freq: Four times a day (QID) | INTRAMUSCULAR | Status: DC | PRN

## 2017-05-25 MED ORDER — FLEET ENEMA 7-19 GM/118ML RE ENEM: 1.0000 | ENEMA | Freq: Once | RECTAL | Status: DC | PRN

## 2017-05-25 MED ORDER — BISACODYL 5 MG PO TBEC: 5.0000 mg | DELAYED_RELEASE_TABLET | Freq: Every day | ORAL | Status: DC | PRN

## 2017-05-25 MED ORDER — ROCURONIUM BROMIDE 100 MG/10ML IV SOLN
INTRAVENOUS | Status: DC | PRN
  Administered 2017-05-25: 30 mg via INTRAVENOUS

## 2017-05-25 MED ORDER — DEXAMETHASONE SODIUM PHOSPHATE 10 MG/ML IJ SOLN
INTRAMUSCULAR | Status: DC | PRN
  Administered 2017-05-25: 10 mg via INTRAVENOUS

## 2017-05-25 MED ORDER — FINASTERIDE 5 MG PO TABS
5.0000 mg | ORAL_TABLET | Freq: Every day | ORAL | Status: DC
  Administered 2017-05-26: 5 mg via ORAL
  Filled 2017-05-25: qty 1

## 2017-05-25 MED ORDER — LIDOCAINE 2% (20 MG/ML) 5 ML SYRINGE
INTRAMUSCULAR | Status: AC
  Filled 2017-05-25: qty 5

## 2017-05-25 MED ORDER — CEFAZOLIN SODIUM-DEXTROSE 1-4 GM/50ML-% IV SOLN
1.0000 g | Freq: Four times a day (QID) | INTRAVENOUS | Status: AC
  Administered 2017-05-25 – 2017-05-26 (×3): 1 g via INTRAVENOUS
  Filled 2017-05-25 (×3): qty 50

## 2017-05-25 MED ORDER — LOSARTAN POTASSIUM 50 MG PO TABS
50.0000 mg | ORAL_TABLET | Freq: Every day | ORAL | Status: DC
  Administered 2017-05-26: 50 mg via ORAL
  Filled 2017-05-25: qty 1

## 2017-05-25 MED ORDER — EPHEDRINE SULFATE-NACL 50-0.9 MG/10ML-% IV SOSY
PREFILLED_SYRINGE | INTRAVENOUS | Status: DC | PRN
  Administered 2017-05-25 (×5): 10 mg via INTRAVENOUS

## 2017-05-25 MED ORDER — ONDANSETRON HCL 4 MG/2ML IJ SOLN: 4.0000 mg | Freq: Four times a day (QID) | INTRAMUSCULAR | Status: DC | PRN

## 2017-05-25 MED ORDER — HYDROMORPHONE HCL 2 MG/ML IJ SOLN: 0.2500 mg | INTRAMUSCULAR | Status: DC | PRN

## 2017-05-25 MED ORDER — ONDANSETRON HCL 4 MG/2ML IJ SOLN
INTRAMUSCULAR | Status: AC
  Filled 2017-05-25: qty 2

## 2017-05-25 MED ORDER — ONDANSETRON HCL 4 MG/2ML IJ SOLN
INTRAMUSCULAR | Status: DC | PRN
  Administered 2017-05-25: 4 mg via INTRAVENOUS

## 2017-05-25 MED ORDER — SODIUM CHLORIDE 0.9 % IR SOLN
Status: DC | PRN
  Administered 2017-05-25: 3000 mL

## 2017-05-25 MED ORDER — PHENYLEPHRINE HCL 10 MG/ML IJ SOLN
INTRAVENOUS | Status: DC | PRN
  Administered 2017-05-25: 50 ug/min via INTRAVENOUS

## 2017-05-25 MED ORDER — OXYCODONE HCL 5 MG/5ML PO SOLN: 5.0000 mg | Freq: Once | ORAL | Status: DC | PRN

## 2017-05-25 MED ORDER — FENTANYL CITRATE (PF) 250 MCG/5ML IJ SOLN
INTRAMUSCULAR | Status: AC
Start: 2017-05-25 — End: ?
  Filled 2017-05-25: qty 5

## 2017-05-25 MED ORDER — ROCURONIUM BROMIDE 10 MG/ML (PF) SYRINGE
PREFILLED_SYRINGE | INTRAVENOUS | Status: AC
  Filled 2017-05-25: qty 5

## 2017-05-25 MED ORDER — POVIDONE-IODINE 7.5 % EX SOLN
Freq: Once | CUTANEOUS | Status: DC
  Filled 2017-05-25: qty 118

## 2017-05-25 MED ORDER — ASPIRIN 81 MG PO TBEC: 81.0000 mg | DELAYED_RELEASE_TABLET | Freq: Two times a day (BID) | ORAL | 0 refills | Status: AC

## 2017-05-25 MED ORDER — OXYCODONE HCL 5 MG PO TABS: 5.0000 mg | ORAL_TABLET | Freq: Once | ORAL | Status: DC | PRN

## 2017-05-25 MED ORDER — ALUMINUM HYDROXIDE GEL 320 MG/5ML PO SUSP: 15.0000 mL | ORAL | Status: DC | PRN

## 2017-05-25 MED ORDER — DIPHENHYDRAMINE HCL 12.5 MG/5ML PO ELIX: 12.5000 mg | ORAL_SOLUTION | ORAL | Status: DC | PRN

## 2017-05-25 MED ORDER — METOCLOPRAMIDE HCL 5 MG PO TABS: 5.0000 mg | ORAL_TABLET | Freq: Three times a day (TID) | ORAL | Status: DC | PRN

## 2017-05-25 MED ORDER — SENNOSIDES-DOCUSATE SODIUM 8.6-50 MG PO TABS: 1.0000 | ORAL_TABLET | Freq: Every evening | ORAL | Status: DC | PRN

## 2017-05-25 MED ORDER — PHENYLEPHRINE 40 MCG/ML (10ML) SYRINGE FOR IV PUSH (FOR BLOOD PRESSURE SUPPORT)
PREFILLED_SYRINGE | INTRAVENOUS | Status: AC
  Filled 2017-05-25: qty 10

## 2017-05-25 MED ORDER — ONDANSETRON HCL 4 MG PO TABS: 4.0000 mg | ORAL_TABLET | Freq: Four times a day (QID) | ORAL | Status: DC | PRN

## 2017-05-25 MED ORDER — ACETAMINOPHEN 500 MG PO TABS
1000.0000 mg | ORAL_TABLET | Freq: Four times a day (QID) | ORAL | Status: AC
  Administered 2017-05-25 – 2017-05-26 (×4): 1000 mg via ORAL
  Filled 2017-05-25 (×4): qty 2

## 2017-05-25 MED ORDER — LORATADINE 10 MG PO TABS
10.0000 mg | ORAL_TABLET | Freq: Every day | ORAL | Status: DC
  Administered 2017-05-26: 10 mg via ORAL
  Filled 2017-05-25: qty 1

## 2017-05-25 MED ORDER — SPIRONOLACTONE 25 MG PO TABS
25.0000 mg | ORAL_TABLET | Freq: Every day | ORAL | Status: DC
  Administered 2017-05-26: 25 mg via ORAL
  Filled 2017-05-25: qty 1

## 2017-05-25 MED ORDER — OXYCODONE HCL 5 MG PO TABS
10.0000 mg | ORAL_TABLET | ORAL | Status: DC | PRN
  Filled 2017-05-25: qty 2

## 2017-05-25 MED ORDER — MENTHOL 3 MG MT LOZG: 1.0000 | LOZENGE | OROMUCOSAL | Status: DC | PRN

## 2017-05-25 MED ORDER — CEFAZOLIN SODIUM-DEXTROSE 2-4 GM/100ML-% IV SOLN
2.0000 g | INTRAVENOUS | Status: AC
  Administered 2017-05-25: 2 g via INTRAVENOUS

## 2017-05-25 MED ORDER — MIDAZOLAM HCL 2 MG/2ML IJ SOLN
INTRAMUSCULAR | Status: AC
  Filled 2017-05-25: qty 2

## 2017-05-25 MED ORDER — DEXAMETHASONE SODIUM PHOSPHATE 10 MG/ML IJ SOLN
INTRAMUSCULAR | Status: AC
  Filled 2017-05-25: qty 1

## 2017-05-25 MED ORDER — ATORVASTATIN CALCIUM 80 MG PO TABS
80.0000 mg | ORAL_TABLET | Freq: Every day | ORAL | Status: DC
  Administered 2017-05-25: 80 mg via ORAL
  Filled 2017-05-25: qty 1

## 2017-05-25 MED ORDER — ASPIRIN EC 81 MG PO TBEC
81.0000 mg | DELAYED_RELEASE_TABLET | Freq: Two times a day (BID) | ORAL | Status: DC
  Administered 2017-05-25 – 2017-05-26 (×2): 81 mg via ORAL
  Filled 2017-05-25 (×2): qty 1

## 2017-05-25 MED ORDER — SUGAMMADEX SODIUM 200 MG/2ML IV SOLN
INTRAVENOUS | Status: AC
  Filled 2017-05-25: qty 2

## 2017-05-25 MED ORDER — TRANEXAMIC ACID 1000 MG/10ML IV SOLN
1000.0000 mg | INTRAVENOUS | Status: AC
  Administered 2017-05-25: 1000 mg via INTRAVENOUS
  Filled 2017-05-25: qty 1100

## 2017-05-25 MED ORDER — TAMSULOSIN HCL 0.4 MG PO CAPS
0.4000 mg | ORAL_CAPSULE | Freq: Every evening | ORAL | Status: DC
  Administered 2017-05-25: 0.4 mg via ORAL
  Filled 2017-05-25: qty 1

## 2017-05-25 MED ORDER — SUGAMMADEX SODIUM 200 MG/2ML IV SOLN
INTRAVENOUS | Status: DC | PRN
  Administered 2017-05-25: 200 mg via INTRAVENOUS

## 2017-05-25 MED ORDER — GLYCOPYRROLATE 0.2 MG/ML IV SOSY
PREFILLED_SYRINGE | INTRAVENOUS | Status: DC | PRN
  Administered 2017-05-25: .1 mg via INTRAVENOUS

## 2017-05-25 MED ORDER — PHENOL 1.4 % MT LIQD: 1.0000 | OROMUCOSAL | Status: DC | PRN

## 2017-05-25 MED ORDER — DOCUSATE SODIUM 100 MG PO CAPS
100.0000 mg | ORAL_CAPSULE | Freq: Two times a day (BID) | ORAL | Status: DC
  Administered 2017-05-25 – 2017-05-26 (×2): 100 mg via ORAL
  Filled 2017-05-25 (×2): qty 1

## 2017-05-25 MED ORDER — BUPIVACAINE LIPOSOME 1.3 % IJ SUSP
INTRAMUSCULAR | Status: DC | PRN
  Administered 2017-05-25: 10 mL via PERINEURAL

## 2017-05-25 MED ORDER — PROPOFOL 10 MG/ML IV BOLUS
INTRAVENOUS | Status: DC | PRN
  Administered 2017-05-25: 80 mg via INTRAVENOUS

## 2017-05-25 SURGICAL SUPPLY — 74 items
AID PSTN UNV HD RSTRNT DISP (MISCELLANEOUS) ×1
BIT DRILL 5/64X5 DISP (BIT) ×2 IMPLANT
BLADE SAW SAG 73X25 THK (BLADE) ×1
BLADE SAW SGTL 73X25 THK (BLADE) ×1 IMPLANT
BLADE SURG 15 STRL LF DISP TIS (BLADE) ×1 IMPLANT
BLADE SURG 15 STRL SS (BLADE) ×2
CEMENT BONE DEPUY (Cement) ×1 IMPLANT
CHLORAPREP W/TINT 26ML (MISCELLANEOUS) ×3 IMPLANT
COVER SURGICAL LIGHT HANDLE (MISCELLANEOUS) ×2 IMPLANT
DRAPE INCISE IOBAN 66X45 STRL (DRAPES) ×2 IMPLANT
DRAPE ORTHO SPLIT 77X108 STRL (DRAPES) ×4
DRAPE SURG 17X23 STRL (DRAPES) ×2 IMPLANT
DRAPE SURG ORHT 6 SPLT 77X108 (DRAPES) ×2 IMPLANT
DRAPE U-SHAPE 47X51 STRL (DRAPES) ×2 IMPLANT
DRSG AQUACEL AG ADV 3.5X10 (GAUZE/BANDAGES/DRESSINGS) ×1 IMPLANT
ELECT BLADE 4.0 EZ CLEAN MEGAD (MISCELLANEOUS)
ELECT REM PT RETURN 9FT ADLT (ELECTROSURGICAL) ×2
ELECTRODE BLDE 4.0 EZ CLN MEGD (MISCELLANEOUS) IMPLANT
ELECTRODE REM PT RTRN 9FT ADLT (ELECTROSURGICAL) ×1 IMPLANT
GLENOID AEQUALIS PERFORM XL40 (Shoulder) ×1 IMPLANT
GLOVE BIO SURGEON STRL SZ7 (GLOVE) ×2 IMPLANT
GLOVE BIO SURGEON STRL SZ7.5 (GLOVE) ×2 IMPLANT
GLOVE BIOGEL PI IND STRL 6.5 (GLOVE) IMPLANT
GLOVE BIOGEL PI IND STRL 7.0 (GLOVE) ×1 IMPLANT
GLOVE BIOGEL PI IND STRL 7.5 (GLOVE) IMPLANT
GLOVE BIOGEL PI IND STRL 8 (GLOVE) ×1 IMPLANT
GLOVE BIOGEL PI INDICATOR 6.5 (GLOVE) ×2
GLOVE BIOGEL PI INDICATOR 7.0 (GLOVE) ×1
GLOVE BIOGEL PI INDICATOR 7.5 (GLOVE) ×1
GLOVE BIOGEL PI INDICATOR 8 (GLOVE) ×1
GLOVE SURG SS PI 6.0 STRL IVOR (GLOVE) ×2 IMPLANT
GLOVE SURG SS PI 7.0 STRL IVOR (GLOVE) ×1 IMPLANT
GLOVE SURG SS PI 7.5 STRL IVOR (GLOVE) ×1 IMPLANT
GOWN STRL REUS W/ TWL LRG LVL3 (GOWN DISPOSABLE) ×1 IMPLANT
GOWN STRL REUS W/ TWL XL LVL3 (GOWN DISPOSABLE) ×1 IMPLANT
GOWN STRL REUS W/TWL LRG LVL3 (GOWN DISPOSABLE) ×2
GOWN STRL REUS W/TWL XL LVL3 (GOWN DISPOSABLE) ×2
GUIDEWIRE GLENOID 2.5X220 (WIRE) ×1 IMPLANT
HANDPIECE INTERPULSE COAX TIP (DISPOSABLE) ×2
HEAD HUM AEQUALIS 54X4X23 (Head) ×1 IMPLANT
HEMOSTAT SURGICEL 2X14 (HEMOSTASIS) ×2 IMPLANT
HOOD PEEL AWAY FLYTE STAYCOOL (MISCELLANEOUS) ×5 IMPLANT
KIT BASIN OR (CUSTOM PROCEDURE TRAY) ×2 IMPLANT
KIT TURNOVER KIT B (KITS) ×2 IMPLANT
MANIFOLD NEPTUNE II (INSTRUMENTS) ×2 IMPLANT
NDL MAYO TROCAR (NEEDLE) ×1 IMPLANT
NEEDLE MAYO TROCAR (NEEDLE) ×2 IMPLANT
NS IRRIG 1000ML POUR BTL (IV SOLUTION) ×2 IMPLANT
PACK SHOULDER (CUSTOM PROCEDURE TRAY) ×2 IMPLANT
PAD ARMBOARD 7.5X6 YLW CONV (MISCELLANEOUS) ×4 IMPLANT
RESTRAINT HEAD UNIVERSAL NS (MISCELLANEOUS) ×2 IMPLANT
RETRIEVER SUT HEWSON (MISCELLANEOUS) ×2 IMPLANT
SET HNDPC FAN SPRY TIP SCT (DISPOSABLE) ×1 IMPLANT
SLING ARM FOAM STRAP LRG (SOFTGOODS) ×2 IMPLANT
SLING ARM FOAM STRAP MED (SOFTGOODS) IMPLANT
SMARTMIX MINI TOWER (MISCELLANEOUS) ×2
SPONGE LAP 18X18 X RAY DECT (DISPOSABLE) ×2 IMPLANT
SPONGE LAP 4X18 X RAY DECT (DISPOSABLE) IMPLANT
STEM HUMERAL PTC 6C 137.5 86 (Stem) ×1 IMPLANT
STRIP CLOSURE SKIN 1/2X4 (GAUZE/BANDAGES/DRESSINGS) ×2 IMPLANT
SUCTION FRAZIER HANDLE 10FR (MISCELLANEOUS) ×1
SUCTION TUBE FRAZIER 10FR DISP (MISCELLANEOUS) ×1 IMPLANT
SUPPORT WRAP ARM LG (MISCELLANEOUS) ×2 IMPLANT
SUT ETHIBOND NAB CT1 #1 30IN (SUTURE) ×6 IMPLANT
SUT FIBERWIRE #2 38 T-5 BLUE (SUTURE)
SUT MNCRL AB 4-0 PS2 18 (SUTURE) ×2 IMPLANT
SUT VIC AB 2-0 CT1 27 (SUTURE) ×2
SUT VIC AB 2-0 CT1 TAPERPNT 27 (SUTURE) ×1 IMPLANT
SUTURE FIBERWR #2 38 T-5 BLUE (SUTURE) IMPLANT
TAPE LABRALWHITE 1.5X36 (TAPE) ×2 IMPLANT
TAPE SUT LABRALTAP WHT/BLK (SUTURE) ×2 IMPLANT
TOWEL OR 17X26 10 PK STRL BLUE (TOWEL DISPOSABLE) ×2 IMPLANT
TOWER SMARTMIX MINI (MISCELLANEOUS) ×1 IMPLANT
TUBING BULK SUCTION (MISCELLANEOUS) ×1 IMPLANT

## 2017-05-25 NOTE — Discharge Instructions (Signed)

## 2017-05-25 NOTE — Anesthesia Procedure Notes (Signed)
Procedure Name: Intubation Date/Time: 05/25/2017 7:37 AM Performed by: Gwyndolyn Saxon, CRNA Pre-anesthesia Checklist: Patient identified, Emergency Drugs available, Suction available, Patient being monitored and Timeout performed Patient Re-evaluated:Patient Re-evaluated prior to induction Oxygen Delivery Method: Circle system utilized Preoxygenation: Pre-oxygenation with 100% oxygen Induction Type: IV induction Ventilation: Mask ventilation without difficulty Laryngoscope Size: Miller and 2 Grade View: Grade I Tube type: Oral Tube size: 7.5 mm Number of attempts: 1 Placement Confirmation: ETT inserted through vocal cords under direct vision,  positive ETCO2,  CO2 detector and breath sounds checked- equal and bilateral Secured at: 23 cm Tube secured with: Tape Dental Injury: Teeth and Oropharynx as per pre-operative assessment

## 2017-05-25 NOTE — Anesthesia Procedure Notes (Signed)
Anesthesia Regional Block: Interscalene brachial plexus block   Pre-Anesthetic Checklist: ,, timeout performed, Correct Patient, Correct Site, Correct Laterality, Correct Procedure, Correct Position, site marked, Risks and benefits discussed,  Surgical consent,  Pre-op evaluation,  At surgeon's request and post-op pain management  Laterality: Upper and Right  Prep: chloraprep       Needles:  Injection technique: Single-shot  Needle Type: Stimulator Needle - 40     Needle Length: 4cm  Needle Gauge: 21   Needle insertion depth: 3 cm   Additional Needles:   Procedures:, nerve stimulator,,, ultrasound used (permanent image in chart),,,,   Nerve Stimulator or Paresthesia:  Response: Twitch elicited, 0.5 mA, 0.3 ms,   Additional Responses:   Narrative:  Start time: 05/25/2017 6:55 AM End time: 05/25/2017 7:10 AM Injection made incrementally with aspirations every 5 mL.  Performed by: Personally  Anesthesiologist: Rica Koyanagi, MD  Additional Notes: Block assessed prior to start of surgery

## 2017-05-25 NOTE — Transfer of Care (Signed)
Immediate Anesthesia Transfer of Care Note  Patient: Jesus Warner  Procedure(s) Performed: RIGHT TOTAL SHOULDER ARTHROPLASTY (Right Shoulder)  Patient Location: PACU  Anesthesia Type:General  Level of Consciousness: awake, alert  and oriented  Airway & Oxygen Therapy: Patient Spontanous Breathing and Patient connected to face mask oxygen  Post-op Assessment: Report given to RN and Post -op Vital signs reviewed and stable  Post vital signs: Reviewed and stable  Last Vitals:  Vitals Value Taken Time  BP 103/70 05/25/2017  9:25 AM  Temp    Pulse 71 05/25/2017  9:25 AM  Resp 21 05/25/2017  9:25 AM  SpO2 97 % 05/25/2017  9:25 AM  Vitals shown include unvalidated device data.  Last Pain:  Vitals:   05/25/17 0618  TempSrc:   PainSc: 0-No pain      Patients Stated Pain Goal: 3 (56/25/63 8937)  Complications: No apparent anesthesia complications

## 2017-05-25 NOTE — H&P (Signed)
Jesus Warner is an 82 y.o. male.   Chief Complaint: R shoulder pain and dysfunction HPI: Endstage R shoulder arthritis with significant pain and dysfunction, failed conservative measures.  Pain interferes with sleep and quality of life.  Past Medical History:  Diagnosis Date  . Arthritis   . BPH (benign prostatic hyperplasia)   . CAD (coronary artery disease)    a. Anterior STEMI (9/14) => s/p DES-mid LAD (LHC 09/06/12: LAD occluded, after reperfusion distal LAD 40%, oCFX 80%, RCA with minimal irregularities, EF 40% with distal anterior, apical and infra-apical AK. PCI: Xience (3 x 18 mm) DES to the mid LAD. CFX was a small vessel and medical therapy was planned)  . Chronic systolic CHF (congestive heart failure) ([place])   . HTN (hypertension)   . Hyperlipidemia   . Ischemic cardiomyopathy    a.  Echocardiogram 09/06/12: EF 25-30%, mid to apical anteroseptal and inferoseptal AK, apical lateral AK, mid to apical anterior severe HK, true apex AK, grade 1 diastolic dysfunction, trivial MR, mildly reduced RVSF, PASP 38;   b. Echo (12/2012): Mild LVH, EF 45-50%, PASP 40  . Pneumonia   . Primary osteoarthritis of shoulder    Right  . Seasonal allergies     Past Surgical History:  Procedure Laterality Date  . CORONARY ANGIOPLASTY WITH STENT PLACEMENT  09/06/2012   total mid LAD occlusion s/p DES, 40% distal LAD, 80% ostial LCx (small vessel supplying single OM branch), widely patent RCA; akinesis of distal anterior, apical and inferoapical walls; EF 40%  . LEFT HEART CATHETERIZATION WITH CORONARY ANGIOGRAM N/A 09/06/2012   Procedure: LEFT HEART CATHETERIZATION WITH CORONARY ANGIOGRAM;  Surgeon: [name], MD;  Location: [facility] CATH LAB;  Service: Cardiovascular;  Laterality: N/A;  . PERCUTANEOUS CORONARY STENT INTERVENTION (PCI-S)  09/06/2012   Procedure: PERCUTANEOUS CORONARY STENT INTERVENTION (PCI-S);  Surgeon: [name], MD;  Location: [facility] CATH LAB;  Service: Cardiovascular;;    Family  History  Problem Relation Age of Onset  . Emphysema Father        died @ 2  . Cancer Mother        died @ 72  . Diabetes Mother   . Cancer Maternal Aunt   . Cancer Maternal Aunt    Social History:  reports that he has never smoked. He has never used smokeless tobacco. He reports that he does not drink alcohol or use drugs.  Allergies: No Known Allergies  Medications Prior to Admission  Medication Sig Dispense Refill  . acetaminophen (TYLENOL) 500 MG tablet Take 500 mg by mouth every 6 (six) hours as needed (for pain/headaches.).    Marland Kitchen aspirin EC 81 MG EC tablet Take 1 tablet (81 mg total) by mouth daily.    Marland Kitchen atorvastatin (LIPITOR) 80 MG tablet TAKE 1 TABLET BY MOUTH EVERY DAY AT 6:00PM 90 tablet 3  . carvedilol (COREG) 6.25 MG tablet TAKE 1 TABLET BY MOUTH TWICE A DAY WITH MEALS. 180 tablet 3  . cetirizine (ZYRTEC) 10 MG tablet Take 10 mg by mouth daily.    . finasteride (PROSCAR) 5 MG tablet Take 5 mg by mouth daily.   0  . fluticasone (FLONASE) 50 MCG/ACT nasal spray Place 1 spray into both nostrils daily as needed for allergies.    Marland Kitchen levocetirizine (XYZAL) 5 MG tablet Take 1 tablet by mouth daily.  1  . losartan (COZAAR) 50 MG tablet TAKE 1 TABLET(50 MG) BY MOUTH DAILY 90 tablet 3  . nitroGLYCERIN (NITROSTAT) 0.4 MG SL tablet  Place 1 tablet (0.4 mg total) under the tongue every 5 (five) minutes x 3 doses as needed for chest pain. 25 tablet 5  . ranitidine (ZANTAC) 75 MG tablet Take 75 mg by mouth daily as needed for heartburn.    . spironolactone (ALDACTONE) 25 MG tablet TAKE 1 TABLET BY MOUTH DAILY 90 tablet 0  . tamsulosin (FLOMAX) 0.4 MG CAPS capsule Take 0.4 mg by mouth every evening.  11    No results found for this or any previous visit (from the past 48 hour(s)). No results found.  Review of Systems  All other systems reviewed and are negative.   Blood pressure 121/67, pulse (!) 52, temperature 97.7 F (36.5 C), temperature source Oral, resp. rate 20, height 5\' 10"   (1.778 m), weight 77.3 kg (170 lb 6.4 oz), SpO2 99 %. Physical Exam  Constitutional: He is oriented to person, place, and time. He appears well-developed and well-nourished.  HENT:  Head: Atraumatic.  Eyes: EOM are normal.  Cardiovascular: Intact distal pulses.  Respiratory: Effort normal.  Musculoskeletal:  R shoulder pain with limited ROM. NVID.  Neurological: He is alert and oriented to person, place, and time.  Skin: Skin is warm and dry.  Psychiatric: He has a normal mood and affect.     Assessment/Plan Endstage R shoulder arthritis with significant pain and dysfunction, failed conservative measures.  Pain interferes with sleep and quality of life. Plan R TSA Risks / benefits of surgery discussed Consent on chart  NPO for OR Preop antibiotics    Isabella Stalling, MD 05/25/2017, 7:18 AM

## 2017-05-25 NOTE — Addendum Note (Signed)
Addendum  created 05/25/17 1325 by Gwyndolyn Saxon, CRNA   Charge Capture section accepted

## 2017-05-25 NOTE — Op Note (Signed)
Procedure(s): RIGHT TOTAL SHOULDER ARTHROPLASTY Procedure Note  Jesus Warner male 82 y.o. 05/25/2017  Procedure(s) and Anesthesia Type:    * RIGHT TOTAL SHOULDER ARTHROPLASTY - Choice      Right shoulder long head proximal biceps tenodesis  Surgeon(s) and Role:    Tania Ade, MD - Primary   Indications:  81 y.o. male  With endstage right shoulder arthritis. Pain and dysfunction interfered with quality of life and nonoperative treatment with activity modification, NSAIDS and injections failed.     Surgeon: Isabella Stalling   Assistants: Jeanmarie Hubert PA-C [facility] was present and scrubbed throughout the procedure and was essential in positioning, retraction, exposure, and closure)  Anesthesia: General endotracheal anesthesia with preoperative interscalene block given by the attending anesthesiologist    Procedure Detail  RIGHT TOTAL SHOULDER ARTHROPLASTY  Findings: Tornier flex anatomic press-fit size 6 stem with a 54 head, cemented size XL40 Cortiloc glenoid.   A lesser tuberosity osteotomy was performed and repaired at the conclusion of the procedure.  Bone density was poor.  Estimated Blood Loss:  200 mL         Drains: None   Blood Given: none          Specimens: none        Complications:  * No complications entered in OR log *         Disposition: PACU - hemodynamically stable.         Condition: stable    Procedure:   The patient was identified in the preoperative holding area where I personally marked the operative extremity after verifying with the patient and consent. He  was taken to the operating room where He was transferred to the   operative table.  The patient received an interscalene block in   the holding area by the attending anesthesiologist.  General anesthesia was induced   in the operating room without complication.  The patient did receive IV  Ancef prior to the commencement of the procedure. The patient received 1 g IV  tranexamic acid at the start of the case around time of the incision.  The patient was   placed in the beach-chair position with the back raised about 30   degrees.  The nonoperative extremity and head and neck were carefully   positioned and padded protecting against neurovascular compromise.  The   left upper extremity was then prepped and draped in the standard sterile   fashion.    The appropriate operative time-out was performed with   Anesthesia, the perioperative staff, as well as myself and we all agreed   that the right side was the correct operative site.  An approximately   10 cm incision was made from the tip of the coracoid to the center point of the   humerus at the level of the axilla.  Dissection was carried down sharply   through subcutaneous tissues and cephalic vein was identified and taken   laterally with the deltoid.  The pectoralis major was taken medially.  The   upper 1 cm of the pectoralis major was released from its attachment on   the humerus.  The clavipectoral fascia was incised just lateral to the   conjoined tendon.  This incision was carried up to but not into the   coracoacromial ligament.  Digital palpation was used to prove   integrity of the axillary nerve which was protected throughout the   procedure.  Musculocutaneous nerve was not palpated in the  operative   field.  Conjoined tendon was then retracted gently medially and the   deltoid laterally.  Anterior circumflex humeral vessels were clamped and   coagulated.  The soft tissues overlying the biceps was incised and this   incision was carried across the transverse humeral ligament to the base   of the coracoid.  The biceps was noted to be severely degenerated. It was released from the superior labrum. The biceps was then tenodesed to the soft tissue just above   pectoralis major and the remaining portion of the biceps superiorly was   excised.  An osteotomy was performed at the lesser tuberosity.   The capsule was then   released all the way down to the 6 o'clock position of the humeral head.   The humeral head was then delivered with simultaneous adduction,   extension and external rotation.  All humeral osteophytes were removed   and the anatomic neck of the humerus was marked and cut free hand at   approximately 25 degrees retroversion within about 3 mm of the cuff   reflection posteriorly.  The head size was estimated to be a 54 medium   offset.  At that point, the humeral head was retracted posteriorly with   a Fukuda retractor.   Remaining portion of the capsule was released at the base of the   coracoid.  The remaining biceps anchor and the entire anterior-inferior   labrum was excised.  The posterior labrum was also excised but the   posterior capsule was not released.  The guidepin was placed bicortically with non elevated guide.  The reamer was used to ream to concentric bone with punctate bleeding.  This gave an excellent concentric surface.  The center hole was then drilled for an anchor peg glenoid followed by the three peripheral holes and none of the holes   exited the glenoid wall.  I then pulse irrigated these holes and dried   them with Surgicel.  The three peripheral holes were then   pressurized cemented and the anchor peg glenoid was placed and impacted   with an excellent fit.  The glenoid was a 40XL component.  The proximal humerus was then again exposed taking care not to displace the glenoid.    The entry awl was used followed by sounding reamers and then sequentially broached from size 1-6. This was then left in place and the calcar planer was used. Trial head was placed with a 54.  With the trial implantation of the component,  there was approximately 50% posterior translation with immediate snap back to the   anatomic position.  With forward elevation, there was no tendency   towards posterior subluxation.   The trial was removed and the final implant was prepared  on a back table.  The trial was removed and the final implant was prepared on a back table.   3 small holes were drilled on the medial side of the lesser tuberosity osteotomy, through which 2 labral tapes were passed. The implant was then placed through the loop of the 2 labral tapes and impacted with an excellent press-fit. This achieved excellent anatomic reconstruction of the proximal humerus.  The joint was then copiously irrigated with pulse lavage.  The subscapularis and   lesser tuberosity osteotomy were then repaired using the 2 labral tapes previously passed in a double row fashion with horizontal mattress sutures medially brought over through bone tunnels tied over a bone bridge laterally.   One #1 Ethibond  was placed at the rotator interval just above   the lesser tuberosity. Copious irrigation was used. Skin was closed with 2-0 Vicryl sutures in the deep dermal layer and 4-0 Monocryl in a subcuticular  running fashion.  Sterile dressings were then applied including Aquacel.  The patient was placed in a sling and allowed to awaken from general anesthesia and taken to the recovery room in stable condition.      POSTOPERATIVE PLAN:  Early passive range of motion will be allowed with the goal of 0 degrees external rotation and 90 degrees forward elevation.  No internal rotation at this time.  No active motion of the arm until the lesser tuberosity heals.  The patient will likely be kept in the hospital for 1-2 days and then discharged home.

## 2017-05-25 NOTE — OR Nursing (Signed)
Pt's Dentures retrieved from wife in waiting room and given back to pt.

## 2017-05-25 NOTE — Anesthesia Postprocedure Evaluation (Signed)
Anesthesia Post Note  Patient: EMON LANCE  Procedure(s) Performed: RIGHT TOTAL SHOULDER ARTHROPLASTY (Right Shoulder)     Patient location during evaluation: PACU Anesthesia Type: General and Regional Level of consciousness: awake and alert Pain management: pain level controlled Vital Signs Assessment: post-procedure vital signs reviewed and stable Respiratory status: spontaneous breathing, nonlabored ventilation, respiratory function stable and patient connected to nasal cannula oxygen Cardiovascular status: blood pressure returned to baseline and stable Postop Assessment: no apparent nausea or vomiting Anesthetic complications: no    Last Vitals:  Vitals:   05/25/17 1025 05/25/17 1035  BP: (!) 142/71   Pulse: (!) 56   Resp: 20   Temp:  (!) 36.3 C  SpO2: 97%     Last Pain:  Vitals:   05/25/17 1025  TempSrc:   PainSc: 0-No pain                 Diamonds Lippard,JAMES TERRILL

## 2017-05-25 NOTE — Anesthesia Preprocedure Evaluation (Signed)
Anesthesia Evaluation  Patient identified by MRN, date of birth, ID band Patient awake    Reviewed: Allergy & Precautions, NPO status , Patient's Chart, lab work & pertinent test results  History of Anesthesia Complications Negative for: history of anesthetic complications  Airway Mallampati: I  TM Distance: >3 FB Neck ROM: Full    Dental  (+) Edentulous Upper, Partial Lower   Pulmonary neg pulmonary ROS,    breath sounds clear to auscultation       Cardiovascular hypertension, + CAD, + Past MI, + Cardiac Stents and +CHF   Rhythm:Regular Rate:Normal     Neuro/Psych    GI/Hepatic negative GI ROS, Neg liver ROS,   Endo/Other  negative endocrine ROS  Renal/GU      Musculoskeletal  (+) Arthritis ,   Abdominal   Peds  Hematology negative hematology ROS (+)   Anesthesia Other Findings   Reproductive/Obstetrics                             Anesthesia Physical Anesthesia Plan  ASA: III  Anesthesia Plan: General   Post-op Pain Management:  Regional for Post-op pain   Induction: Intravenous  PONV Risk Score and Plan: 3 and Treatment may vary due to age or medical condition  Airway Management Planned: Oral ETT  Additional Equipment:   Intra-op Plan:   Post-operative Plan: Extubation in OR  Informed Consent: I have reviewed the patients History and Physical, chart, labs and discussed the procedure including the risks, benefits and alternatives for the proposed anesthesia with the patient or authorized representative who has indicated his/her understanding and acceptance.     Plan Discussed with:   Anesthesia Plan Comments:         Anesthesia Quick Evaluation

## 2017-05-26 LAB — BASIC METABOLIC PANEL
ANION GAP: 9 (ref 5–15)
BUN: 21 mg/dL — ABNORMAL HIGH (ref 6–20)
CALCIUM: 8.6 mg/dL — AB (ref 8.9–10.3)
CO2: 21 mmol/L — AB (ref 22–32)
Chloride: 107 mmol/L (ref 101–111)
Creatinine, Ser: 1.47 mg/dL — ABNORMAL HIGH (ref 0.61–1.24)
GFR, EST AFRICAN AMERICAN: 50 mL/min — AB (ref >60)
GFR, EST NON AFRICAN AMERICAN: 43 mL/min — AB (ref >60)
GLUCOSE: 146 mg/dL — AB (ref 65–99)
POTASSIUM: 4.7 mmol/L (ref 3.5–5.1)
Sodium: 137 mmol/L (ref 135–145)

## 2017-05-26 LAB — CBC
HEMATOCRIT: 37.1 % — AB (ref 39.0–52.0)
Hemoglobin: 12.5 g/dL — ABNORMAL LOW (ref 13.0–17.0)
MCH: 32.1 pg (ref 26.0–34.0)
MCHC: 33.7 g/dL (ref 30.0–36.0)
MCV: 95.4 fL (ref 78.0–100.0)
PLATELETS: 158 10*3/uL (ref 150–400)
RBC: 3.89 MIL/uL — AB (ref 4.22–5.81)
RDW: 12.8 % (ref 11.5–15.5)
WBC: 14.5 10*3/uL — AB (ref 4.0–10.5)

## 2017-05-26 NOTE — Evaluation (Signed)
Occupational Therapy Evaluation Patient Details Name: Jesus Warner MRN: [id number] DOB: December 01, 1935 Today's Date: 05/26/2017    History of Present Illness s/p RIGHT TOTAL SHOULDER ARTHROPLASTY    Clinical Impression   Pt admitted with the above diagnoses and presents with below problem list. Pt will benefit from continued acute OT to address the below listed deficits and maximize independence with basic ADLs prior to d/c home today. PTA pt was independent with ADLs. Pt is currently min guard for functional transfers/mobility, set up to min A with UB/LB ADLs. Plan to see one more time this morning before d/c once spouse is present to practice UB/LB dressing and review shoulder education.       Follow Up Recommendations  Follow surgeon's recommendation for DC plan and follow-up therapies;Supervision/Assistance - 24 hour    Equipment Recommendations  None recommended by OT    Recommendations for Other Services       Precautions / Restrictions Precautions Precautions: Shoulder;Fall Shoulder Interventions: Shoulder sling/immobilizer;At all times;Off for dressing/bathing/exercises Precaution Booklet Issued: Yes (comment) Required Braces or Orthoses: Sling Restrictions Weight Bearing Restrictions: Yes RUE Weight Bearing: Non weight bearing      Mobility Bed Mobility Overal bed mobility: Needs Assistance Bed Mobility: Supine to Sit     Supine to sit: Supervision     General bed mobility comments: HOB slightly elevated. Pt using bed rails on left side.   Transfers Overall transfer level: Needs assistance Equipment used: None Transfers: Sit to/from Stand Sit to Stand: Supervision         General transfer comment: from EOB to recliner    Balance Overall balance assessment: Mild deficits observed, not formally tested                                         ADL either performed or assessed with clinical judgement   ADL Overall ADL's : Needs  assistance/impaired Eating/Feeding: Set up;Sitting   Grooming: Minimal assistance;Sitting   Upper Body Bathing: Moderate assistance;Sitting   Lower Body Bathing: Minimal assistance;Sit to/from stand   Upper Body Dressing : Moderate assistance;Sitting   Lower Body Dressing: Minimal assistance;Sit to/from stand   Toilet Transfer: Min guard;Ambulation   Toileting- Clothing Manipulation and Hygiene: Min guard   Tub/ Shower Transfer: Min guard;Minimal assistance;Walk-in shower;Ambulation   Functional mobility during ADLs: Min guard General ADL Comments: Pt completed bed mobility, in-room functional mobility. Shoulder education initated.      Vision         Perception     Praxis      Pertinent Vitals/Pain Pain Assessment: No/denies pain     Hand Dominance Left   Extremity/Trunk Assessment Upper Extremity Assessment Upper Extremity Assessment: RUE deficits/detail RUE Deficits / Details: s/p RIGHT TOTAL SHOULDER ARTHROPLASTY; nerve block still largely in effect. AAROM elbow, forearm, wrist. Unable to move digits in right hand currently due to block.    Lower Extremity Assessment Lower Extremity Assessment: Overall WFL for tasks assessed       Communication Communication Communication: No difficulties   Cognition Arousal/Alertness: Awake/alert Behavior During Therapy: WFL for tasks assessed/performed Overall Cognitive Status: Within Functional Limits for tasks assessed                                     General Comments  O2 90-91, occasionally 89 until standing  and deep breathing exercises completed then recovered to 95. RA throughout session.    Exercises Exercises: Other exercises Other Exercises Other Exercises: AAROM/PROM e/w/h in supine.    Shoulder Instructions      Home Living Family/patient expects to be discharged to:: Private residence Living Arrangements: Spouse/significant other Available Help at Discharge: Family Type of Home:  House Home Access: Stairs to enter Technical brewer of Steps: 4   Home Layout: One level     Bathroom Shower/Tub: Walk-in shower             Additional Comments: Discussed having someone with him for stairs and shower transfer.      Prior Functioning/Environment Level of Independence: Independent                 OT Problem List: Impaired balance (sitting and/or standing);Decreased knowledge of use of DME or AE;Decreased knowledge of precautions;Impaired UE functional use;Pain      OT Treatment/Interventions:      OT Goals(Current goals can be found in the care plan section) Acute Rehab OT Goals Patient Stated Goal: not stated OT Goal Formulation: With patient Time For Goal Achievement: 06/02/17 Potential to Achieve Goals: Good ADL Goals Pt Will Perform Upper Body Bathing: with set-up;sitting Pt Will Perform Upper Body Dressing: with set-up;sitting Pt Will Perform Lower Body Dressing: with min guard assist;sit to/from stand Additional ADL Goal #1: Pt and spouse will be independent with shoulder eduction.  OT Frequency:     Barriers to D/C:            Co-evaluation              AM-PAC PT "6 Clicks" Daily Activity     Outcome Measure Help from another person eating meals?: None Help from another person taking care of personal grooming?: A Little Help from another person toileting, which includes using toliet, bedpan, or urinal?: A Little Help from another person bathing (including washing, rinsing, drying)?: A Little Help from another person to put on and taking off regular upper body clothing?: A Little Help from another person to put on and taking off regular lower body clothing?: A Little 6 Click Score: 19   End of Session Equipment Utilized During Treatment: Other (comment)(sling) Nurse Communication: Mobility status;Other (comment)(O2 sats)  Activity Tolerance: Patient tolerated treatment well Patient left: in chair;with call bell/phone  within reach  OT Visit Diagnosis: Unsteadiness on feet (R26.81);Pain                Time: 3790-2409 OT Time Calculation (min): 30 min Charges:  OT General Charges $OT Visit: 1 Visit OT Evaluation $OT Eval Low Complexity: 1 Low OT Treatments $Self Care/Home Management : 8-22 mins G-Codes:       Hortencia Pilar 05/26/2017, 10:54 AM

## 2017-05-26 NOTE — Discharge Summary (Signed)
Patient ID: Jesus Warner MRN: [id number] DOB/AGE: May 12, 1935 82 y.o.  Admit date: 05/25/2017 Discharge date: 05/26/2017  Admission Diagnoses:  Active Problems:   Status post total shoulder arthroplasty, right   Discharge Diagnoses:  Same  Past Medical History:  Diagnosis Date  . Arthritis   . BPH (benign prostatic hyperplasia)   . CAD (coronary artery disease)    a. Anterior STEMI (9/14) => s/p DES-mid LAD (LHC 09/06/12: LAD occluded, after reperfusion distal LAD 40%, oCFX 80%, RCA with minimal irregularities, EF 40% with distal anterior, apical and infra-apical AK. PCI: Xience (3 x 18 mm) DES to the mid LAD. CFX was a small vessel and medical therapy was planned)  . Chronic systolic CHF (congestive heart failure) ([place])   . HTN (hypertension)   . Hyperlipidemia   . Ischemic cardiomyopathy    a.  Echocardiogram 09/06/12: EF 25-30%, mid to apical anteroseptal and inferoseptal AK, apical lateral AK, mid to apical anterior severe HK, true apex AK, grade 1 diastolic dysfunction, trivial MR, mildly reduced RVSF, PASP 38;   b. Echo (12/2012): Mild LVH, EF 45-50%, PASP 40  . Pneumonia   . Primary osteoarthritis of shoulder    Right  . Seasonal allergies     Surgeries: Procedure(s): RIGHT TOTAL SHOULDER ARTHROPLASTY on 05/25/2017   Consultants:   Discharged Condition: Improved  Hospital Course: Jesus Warner is an 82 y.o. male who was admitted 05/25/2017 for operative treatment of right shoulder end stage OA. Patient has severe unremitting pain that affects sleep, daily activities, and work/hobbies. After pre-op clearance the patient was taken to the operating room on 05/25/2017 and underwent  Procedure(s): RIGHT TOTAL SHOULDER ARTHROPLASTY.    Patient was given perioperative antibiotics:  Anti-infectives (From admission, onward)   Start     Dose/Rate Route Frequency Ordered Stop   05/25/17 1430  ceFAZolin (ANCEF) IVPB 1 g/50 mL premix     1 g 100 mL/hr over 30 Minutes Intravenous  Every 6 hours 05/25/17 1037 05/26/17 0223   05/25/17 0615  ceFAZolin (ANCEF) IVPB 2g/100 mL premix     2 g 200 mL/hr over 30 Minutes Intravenous On call to O.R. 05/25/17 0603 05/25/17 0808   05/25/17 0606  ceFAZolin (ANCEF) 2-4 GM/100ML-% IVPB    Note to Pharmacy:  Granville Lewis, Lindsi   : cabinet override      05/25/17 0606 05/25/17 0738       Patient was given sequential compression devices, early ambulation, and asa to prevent DVT.  Patient benefited maximally from hospital stay and there were no complications.    Recent vital signs:  Patient Vitals for the past 24 hrs:  BP Temp Temp src Pulse Resp SpO2  05/26/17 0517 111/69 (!) 97.5 F (36.4 C) Oral 62 - 90 %  05/26/17 0001 102/67 97.7 F (36.5 C) Oral 78 - 90 %  05/25/17 2000 108/81 97.6 F (36.4 C) - 87 20 95 %  05/25/17 1428 103/75 - - 84 16 97 %  05/25/17 1058 112/76 (!) 97.4 F (36.3 C) Oral (!) 57 17 97 %  05/25/17 1035 - (!) 97.3 F (36.3 C) - - - -  05/25/17 1025 (!) 142/71 - - (!) 56 20 97 %  05/25/17 1010 (!) 189/169 - - (!) 135 (!) 26 92 %  05/25/17 0955 109/78 - - 67 (!) 22 93 %  05/25/17 0940 98/88 - - 70 16 95 %  05/25/17 0925 103/70 (!) 97.3 F (36.3 C) - 67 (!) 24 95 %  Recent laboratory studies:  Recent Labs    05/26/17 0425  WBC 14.5*  HGB 12.5*  HCT 37.1*  PLT 158  NA 137  K 4.7  CL 107  CO2 21*  BUN 21*  CREATININE 1.47*  GLUCOSE 146*  CALCIUM 8.6*     Discharge Medications:   Allergies as of 05/26/2017   No Known Allergies     Medication List    STOP taking these medications   acetaminophen 500 MG tablet Commonly known as:  TYLENOL     TAKE these medications   aspirin 81 MG EC tablet Take 1 tablet (81 mg total) by mouth 2 (two) times daily for 21 days. What changed:  when to take this   atorvastatin 80 MG tablet Commonly known as:  LIPITOR TAKE 1 TABLET BY MOUTH EVERY DAY AT 6:00PM   carvedilol 6.25 MG tablet Commonly known as:  COREG TAKE 1 TABLET BY MOUTH TWICE A DAY  WITH MEALS.   cetirizine 10 MG tablet Commonly known as:  ZYRTEC Take 10 mg by mouth daily.   finasteride 5 MG tablet Commonly known as:  PROSCAR Take 5 mg by mouth daily.   fluticasone 50 MCG/ACT nasal spray Commonly known as:  FLONASE Place 1 spray into both nostrils daily as needed for allergies.   levocetirizine 5 MG tablet Commonly known as:  XYZAL Take 1 tablet by mouth daily.   losartan 50 MG tablet Commonly known as:  COZAAR TAKE 1 TABLET(50 MG) BY MOUTH DAILY   nitroGLYCERIN 0.4 MG SL tablet Commonly known as:  NITROSTAT Place 1 tablet (0.4 mg total) under the tongue every 5 (five) minutes x 3 doses as needed for chest pain.   oxyCODONE-acetaminophen 5-325 MG tablet Commonly known as:  PERCOCET Take 1-2 tablets by mouth every 4 (four) hours as needed for severe pain.   ranitidine 75 MG tablet Commonly known as:  ZANTAC Take 75 mg by mouth daily as needed for heartburn.   spironolactone 25 MG tablet Commonly known as:  ALDACTONE TAKE 1 TABLET BY MOUTH DAILY   tamsulosin 0.4 MG Caps capsule Commonly known as:  FLOMAX Take 0.4 mg by mouth every evening.       Diagnostic Studies: Dg Chest 2 View  Result Date: 05/16/2017 CLINICAL DATA:  Preoperative examination prior right shoulder arthroplasty. EXAM: CHEST - 2 VIEW COMPARISON:  Chest x-ray of March 27, 2016 FINDINGS: The lungs are adequately inflated. There is no focal infiltrate. There is no pleural effusion. The heart and pulmonary vascularity are normal. There is tortuosity of the descending thoracic aorta. The bony thorax exhibits no acute abnormality. IMPRESSION: There is no acute cardiopulmonary abnormality. Electronically Signed   By: David  Martinique M.D.   On: 05/16/2017 15:24   Ct Shoulder Right Wo Contrast  Result Date: 05/16/2017 CLINICAL DATA:  Right shoulder osteoarthritis. Surgical planning for replacement. EXAM: CT OF THE UPPER RIGHT EXTREMITY WITHOUT CONTRAST TECHNIQUE: Multidetector CT imaging of  the upper right extremity was performed according to the standard protocol. COMPARISON:  None. FINDINGS: Bones/Joint/Cartilage No fracture or dislocation. Normal alignment. No joint effusion. Severe osteoarthritis of the glenohumeral joint with severe joint space narrowing, bone-on-bone appearance, subchondral sclerosis, subchondral cystic changes and marginal osteophytosis. Bony remodeling of the glenoid. Moderate arthropathy of the acromioclavicular joint. Type I acromion. Ligaments Ligaments are suboptimally evaluated by CT. Muscles and Tendons Muscles are normal. No muscle atrophy. Rotator cuff is grossly intact. Amorphous calcification within the biceps tendon and distal supraspinatus and infraspinatus tendons. Soft  tissue No fluid collection or hematoma. No soft tissue mass. 3 mm nodule in the right middle lobe (series 2, image 105). Punctate calcified granuloma in the right upper lobe. IMPRESSION: 1. Severe osteoarthritis of the glenohumeral joint. 2. Moderate osteoarthritis of the acromioclavicular joint. 3. Amorphous calcification within the biceps and distal supraspinatus and infraspinatus tendons may reflect calcific tendinitis. Electronically Signed   By: Titus Dubin M.D.   On: 05/16/2017 16:58   Dg Shoulder Right Port  Result Date: 05/25/2017 CLINICAL DATA:  Total shoulder arthroplasty. EXAM: PORTABLE RIGHT SHOULDER COMPARISON:  CT right shoulder dated May 16, 2017. FINDINGS: Interval total shoulder arthroplasty. Components are well aligned. No acute fracture or dislocation. Postsurgical changes and emphysema about the right shoulder. IMPRESSION: Interval right total shoulder arthroplasty without evidence of acute postoperative complication. Electronically Signed   By: Titus Dubin M.D.   On: 05/25/2017 10:25    Disposition: Discharge disposition: 01-Home or Self Care       Discharge Instructions    Call MD / Call 911   Complete by:  As directed    If you experience chest pain or  shortness of breath, CALL 911 and be transported to the hospital emergency room.  If you develope a fever above 101 F, pus (white drainage) or increased drainage or redness at the wound, or calf pain, call your surgeon's office.   Call MD / Call 911   Complete by:  As directed    If you experience chest pain or shortness of breath, CALL 911 and be transported to the hospital emergency room.  If you develope a fever above 101 F, pus (white drainage) or increased drainage or redness at the wound, or calf pain, call your surgeon's office.   Constipation Prevention   Complete by:  As directed    Drink plenty of fluids.  Prune juice may be helpful.  You may use a stool softener, such as Colace (over the counter) 100 mg twice a day.  Use MiraLax (over the counter) for constipation as needed.   Constipation Prevention   Complete by:  As directed    Drink plenty of fluids.  Prune juice may be helpful.  You may use a stool softener, such as Colace (over the counter) 100 mg twice a day.  Use MiraLax (over the counter) for constipation as needed.   Diet - low sodium heart healthy   Complete by:  As directed    Diet - low sodium heart healthy   Complete by:  As directed    Increase activity slowly as tolerated   Complete by:  As directed    Increase activity slowly as tolerated   Complete by:  As directed       Follow-up Information    Tania Ade, MD. Schedule an appointment as soon as possible for a visit in 2 weeks.   Specialty:  Orthopedic Surgery Contact information: [place] [address] [phone]            Signed: Grier Mitts 05/26/2017, 7:45 AM

## 2017-05-26 NOTE — Progress Notes (Signed)
   PATIENT ID: Jesus Warner   1 Day Post-Op Procedure(s) (LRB): RIGHT TOTAL SHOULDER ARTHROPLASTY (Right)  Subjective: Doing well, pain getting controlled. No other complaints or concerns.  Objective:  Vitals:   05/26/17 0001 05/26/17 0517  BP: 102/67 111/69  Pulse: 78 62  Resp:    Temp: 97.7 F (36.5 C) (!) 97.5 F (36.4 C)  SpO2: 90% 90%     R shoulder dressing c/d/i Wiggles fingers3  Labs:  Recent Labs    05/26/17 0425  HGB 12.5*   Recent Labs    05/26/17 0425  WBC 14.5*  RBC 3.89*  HCT 37.1*  PLT 158   Recent Labs    05/26/17 0425  NA 137  K 4.7  CL 107  CO2 21*  BUN 21*  CREATININE 1.47*  GLUCOSE 146*  CALCIUM 8.6*    Assessment and Plan: 1 day s/p right TSA OT- PROM goal to 90FF, 0 ER D/c home when cleared by OT Scripts in chart Fu w Dr. Tamera Punt in 2 weeks   VTE proph: asa, scds

## 2017-05-26 NOTE — Progress Notes (Signed)
Occupational Therapy Treatment Patient Details Name: Jesus Warner MRN: [id number] DOB: December 17, 1935 Today's Date: 05/26/2017    History of present illness s/p RIGHT TOTAL SHOULDER ARTHROPLASTY    OT comments  Pt seen for second session to address UB/LB dressing. Spouse present and included in review of shoulder education. No further acute OT needs indicated. Pt ok for d/c home today from OT standpoint.    Follow Up Recommendations  Follow surgeon's recommendation for DC plan and follow-up therapies;Supervision/Assistance - 24 hour    Equipment Recommendations  None recommended by OT    Recommendations for Other Services      Precautions / Restrictions Precautions Precautions: Shoulder;Fall Shoulder Interventions: Shoulder sling/immobilizer;At all times;Off for dressing/bathing/exercises Precaution Booklet Issued: Yes (comment) Precaution Comments: reviewed with pt and spouse Required Braces or Orthoses: Sling Restrictions Weight Bearing Restrictions: Yes RUE Weight Bearing: Non weight bearing       Mobility Bed Mobility Overal bed mobility: Needs Assistance Bed Mobility: Supine to Sit     Supine to sit: Supervision     General bed mobility comments: up in chair  Transfers Overall transfer level: Needs assistance Equipment used: None Transfers: Sit to/from Stand Sit to Stand: Supervision         General transfer comment: from recliner to EOB    Balance Overall balance assessment: Mild deficits observed, not formally tested                                         ADL either performed or assessed with clinical judgement   ADL Overall ADL's : Needs assistance/impaired Eating/Feeding: Set up;Sitting   Grooming: Minimal assistance;Sitting   Upper Body Bathing: Moderate assistance;Sitting   Lower Body Bathing: Minimal assistance;Sit to/from stand   Upper Body Dressing : Moderate assistance;Sitting   Lower Body Dressing: Minimal  assistance;Sit to/from stand   Toilet Transfer: Min guard;Ambulation   Toileting- Clothing Manipulation and Hygiene: Min guard   Tub/ Shower Transfer: Min guard;Minimal assistance;Walk-in shower;Ambulation   Functional mobility during ADLs: Min guard General ADL Comments: Pt completed UB/LB dressing with spouse assisting as needed. Spouse present and included in all shoulder education.     Vision       Perception     Praxis      Cognition Arousal/Alertness: Awake/alert Behavior During Therapy: WFL for tasks assessed/performed Overall Cognitive Status: Within Functional Limits for tasks assessed                                          Exercises Exercises: Other exercises Other Exercises Other Exercises: return demo of e/w/h in supine with spouse present and included in education   Shoulder Instructions       General Comments O2 90-91, occasionally 89 until standing and deep breathing exercises completed then recovered to 95. RA throughout session.    Pertinent Vitals/ Pain       Pain Assessment: No/denies pain  Home Living Family/patient expects to be discharged to:: Private residence Living Arrangements: Spouse/significant other Available Help at Discharge: Family Type of Home: House Home Access: Stairs to enter Technical brewer of Steps: 4   Home Layout: One level     Bathroom Shower/Tub: Walk-in shower             Additional Comments: Discussed having someone with him  for stairs and shower transfer.      Prior Functioning/Environment Level of Independence: Independent            Frequency  Min 2X/week        Progress Toward Goals  OT Goals(current goals can now be found in the care plan section)  Progress towards OT goals: Progressing toward goals  Acute Rehab OT Goals Patient Stated Goal: get back to playing guitar OT Goal Formulation: With patient Time For Goal Achievement: 06/02/17 Potential to Achieve  Goals: Good ADL Goals Pt Will Perform Upper Body Bathing: with set-up;sitting Pt Will Perform Upper Body Dressing: with set-up;sitting Pt Will Perform Lower Body Dressing: with min guard assist;sit to/from stand Additional ADL Goal #1: Pt and spouse will be independent with shoulder eduction.  Plan Discharge plan remains appropriate    Co-evaluation                 AM-PAC PT "6 Clicks" Daily Activity     Outcome Measure   Help from another person eating meals?: None Help from another person taking care of personal grooming?: A Little Help from another person toileting, which includes using toliet, bedpan, or urinal?: A Little Help from another person bathing (including washing, rinsing, drying)?: A Little Help from another person to put on and taking off regular upper body clothing?: A Little Help from another person to put on and taking off regular lower body clothing?: A Little 6 Click Score: 19    End of Session Equipment Utilized During Treatment: Other (comment)(sling)  OT Visit Diagnosis: Unsteadiness on feet (R26.81);Pain   Activity Tolerance Patient tolerated treatment well   Patient Left in chair;with call bell/phone within reach   Nurse Communication Mobility status;Other (comment)(O2 sats)        Time: 0092-3300 OT Time Calculation (min): 30 min  Charges: OT General Charges $OT Visit: 1 Visit OT Evaluation $OT Eval Low Complexity: 1 Low OT Treatments $Self Care/Home Management : 23-37 mins     Hortencia Pilar 05/26/2017, 11:42 AM

## 2017-05-30 ENCOUNTER — Encounter (HOSPITAL_COMMUNITY): Payer: Self-pay | Admitting: Orthopedic Surgery

## 2017-05-31 ENCOUNTER — Other Ambulatory Visit: Payer: Self-pay | Admitting: Cardiovascular Disease

## 2017-05-31 ENCOUNTER — Encounter (HOSPITAL_COMMUNITY): Payer: Self-pay | Admitting: Orthopedic Surgery

## 2017-06-07 DIAGNOSIS — M19011 Primary osteoarthritis, right shoulder: Secondary | ICD-10-CM | POA: Diagnosis not present

## 2017-06-12 ENCOUNTER — Encounter

## 2017-06-12 ENCOUNTER — Encounter (HOSPITAL_COMMUNITY): Payer: Self-pay | Admitting: Anesthesiology

## 2017-06-12 ENCOUNTER — Ambulatory Visit: Payer: Medicare Other | Admitting: Cardiovascular Disease

## 2017-06-28 DIAGNOSIS — H401233 Low-tension glaucoma, bilateral, severe stage: Secondary | ICD-10-CM | POA: Diagnosis not present

## 2017-07-05 DIAGNOSIS — M19011 Primary osteoarthritis, right shoulder: Secondary | ICD-10-CM | POA: Diagnosis not present

## 2017-07-13 DIAGNOSIS — M6281 Muscle weakness (generalized): Secondary | ICD-10-CM | POA: Diagnosis not present

## 2017-07-13 DIAGNOSIS — Z96611 Presence of right artificial shoulder joint: Secondary | ICD-10-CM | POA: Diagnosis not present

## 2017-07-13 DIAGNOSIS — M25611 Stiffness of right shoulder, not elsewhere classified: Secondary | ICD-10-CM | POA: Diagnosis not present

## 2017-07-18 DIAGNOSIS — Z96611 Presence of right artificial shoulder joint: Secondary | ICD-10-CM | POA: Diagnosis not present

## 2017-07-18 DIAGNOSIS — M6281 Muscle weakness (generalized): Secondary | ICD-10-CM | POA: Diagnosis not present

## 2017-07-18 DIAGNOSIS — M25611 Stiffness of right shoulder, not elsewhere classified: Secondary | ICD-10-CM | POA: Diagnosis not present

## 2017-07-20 DIAGNOSIS — M6281 Muscle weakness (generalized): Secondary | ICD-10-CM | POA: Diagnosis not present

## 2017-07-20 DIAGNOSIS — Z96611 Presence of right artificial shoulder joint: Secondary | ICD-10-CM | POA: Diagnosis not present

## 2017-07-20 DIAGNOSIS — M25611 Stiffness of right shoulder, not elsewhere classified: Secondary | ICD-10-CM | POA: Diagnosis not present

## 2017-07-25 DIAGNOSIS — M25611 Stiffness of right shoulder, not elsewhere classified: Secondary | ICD-10-CM | POA: Diagnosis not present

## 2017-07-25 DIAGNOSIS — M6281 Muscle weakness (generalized): Secondary | ICD-10-CM | POA: Diagnosis not present

## 2017-07-25 DIAGNOSIS — Z96611 Presence of right artificial shoulder joint: Secondary | ICD-10-CM | POA: Diagnosis not present

## 2017-07-27 DIAGNOSIS — Z96611 Presence of right artificial shoulder joint: Secondary | ICD-10-CM | POA: Diagnosis not present

## 2017-07-27 DIAGNOSIS — M6281 Muscle weakness (generalized): Secondary | ICD-10-CM | POA: Diagnosis not present

## 2017-07-27 DIAGNOSIS — M25611 Stiffness of right shoulder, not elsewhere classified: Secondary | ICD-10-CM | POA: Diagnosis not present

## 2017-08-01 DIAGNOSIS — M25611 Stiffness of right shoulder, not elsewhere classified: Secondary | ICD-10-CM | POA: Diagnosis not present

## 2017-08-01 DIAGNOSIS — Z96611 Presence of right artificial shoulder joint: Secondary | ICD-10-CM | POA: Diagnosis not present

## 2017-08-01 DIAGNOSIS — M6281 Muscle weakness (generalized): Secondary | ICD-10-CM | POA: Diagnosis not present

## 2017-08-02 ENCOUNTER — Other Ambulatory Visit: Payer: Self-pay | Admitting: Cardiovascular Disease

## 2017-08-03 DIAGNOSIS — Z96611 Presence of right artificial shoulder joint: Secondary | ICD-10-CM | POA: Diagnosis not present

## 2017-08-03 DIAGNOSIS — M25611 Stiffness of right shoulder, not elsewhere classified: Secondary | ICD-10-CM | POA: Diagnosis not present

## 2017-08-03 DIAGNOSIS — M6281 Muscle weakness (generalized): Secondary | ICD-10-CM | POA: Diagnosis not present

## 2017-08-07 DIAGNOSIS — Z96611 Presence of right artificial shoulder joint: Secondary | ICD-10-CM | POA: Diagnosis not present

## 2017-08-07 DIAGNOSIS — M25611 Stiffness of right shoulder, not elsewhere classified: Secondary | ICD-10-CM | POA: Diagnosis not present

## 2017-08-07 DIAGNOSIS — M6281 Muscle weakness (generalized): Secondary | ICD-10-CM | POA: Diagnosis not present

## 2017-08-09 DIAGNOSIS — H401233 Low-tension glaucoma, bilateral, severe stage: Secondary | ICD-10-CM | POA: Diagnosis not present

## 2017-08-11 DIAGNOSIS — M25611 Stiffness of right shoulder, not elsewhere classified: Secondary | ICD-10-CM | POA: Diagnosis not present

## 2017-08-11 DIAGNOSIS — M6281 Muscle weakness (generalized): Secondary | ICD-10-CM | POA: Diagnosis not present

## 2017-08-11 DIAGNOSIS — Z96611 Presence of right artificial shoulder joint: Secondary | ICD-10-CM | POA: Diagnosis not present

## 2017-09-27 DIAGNOSIS — E7800 Pure hypercholesterolemia, unspecified: Secondary | ICD-10-CM | POA: Diagnosis not present

## 2017-09-27 DIAGNOSIS — Z0000 Encounter for general adult medical examination without abnormal findings: Secondary | ICD-10-CM | POA: Diagnosis not present

## 2017-09-27 DIAGNOSIS — I2510 Atherosclerotic heart disease of native coronary artery without angina pectoris: Secondary | ICD-10-CM | POA: Diagnosis not present

## 2017-09-27 DIAGNOSIS — Z1389 Encounter for screening for other disorder: Secondary | ICD-10-CM | POA: Diagnosis not present

## 2017-10-30 DIAGNOSIS — M25611 Stiffness of right shoulder, not elsewhere classified: Secondary | ICD-10-CM | POA: Diagnosis not present

## 2017-10-30 DIAGNOSIS — Z471 Aftercare following joint replacement surgery: Secondary | ICD-10-CM | POA: Diagnosis not present

## 2017-10-30 DIAGNOSIS — Z96611 Presence of right artificial shoulder joint: Secondary | ICD-10-CM | POA: Diagnosis not present

## 2017-11-05 ENCOUNTER — Other Ambulatory Visit: Payer: Self-pay | Admitting: Cardiovascular Disease

## 2017-11-07 NOTE — Telephone Encounter (Signed)
Outpatient Medication Detail    Disp Refills Start End   spironolactone (ALDACTONE) 25 MG tablet 90 tablet 2 08/02/2017    Sig: TAKE 1 TABLET BY MOUTH DAILY   Sent to pharmacy as: spironolactone (ALDACTONE) 25 MG tablet   Notes to Pharmacy: **Patient requests 90 days supply**   E-Prescribing Status: Receipt confirmed by pharmacy (08/02/2017 12:55 PM EDT)   [facility], [place] - [address] AT [place]

## 2017-11-15 DIAGNOSIS — M109 Gout, unspecified: Secondary | ICD-10-CM | POA: Diagnosis not present

## 2017-11-15 DIAGNOSIS — M19072 Primary osteoarthritis, left ankle and foot: Secondary | ICD-10-CM | POA: Diagnosis not present

## 2017-11-15 DIAGNOSIS — M67372 Transient synovitis, left ankle and foot: Secondary | ICD-10-CM | POA: Diagnosis not present

## 2017-11-15 DIAGNOSIS — M7752 Other enthesopathy of left foot: Secondary | ICD-10-CM | POA: Diagnosis not present

## 2017-11-27 DIAGNOSIS — M67372 Transient synovitis, left ankle and foot: Secondary | ICD-10-CM | POA: Diagnosis not present

## 2017-11-27 DIAGNOSIS — M7752 Other enthesopathy of left foot: Secondary | ICD-10-CM | POA: Diagnosis not present

## 2017-12-06 DIAGNOSIS — M67372 Transient synovitis, left ankle and foot: Secondary | ICD-10-CM | POA: Diagnosis not present

## 2017-12-06 DIAGNOSIS — M7752 Other enthesopathy of left foot: Secondary | ICD-10-CM | POA: Diagnosis not present

## 2017-12-26 DIAGNOSIS — N401 Enlarged prostate with lower urinary tract symptoms: Secondary | ICD-10-CM | POA: Diagnosis not present

## 2017-12-26 DIAGNOSIS — R3912 Poor urinary stream: Secondary | ICD-10-CM | POA: Diagnosis not present

## 2018-01-10 DIAGNOSIS — H2513 Age-related nuclear cataract, bilateral: Secondary | ICD-10-CM | POA: Diagnosis not present

## 2018-01-10 DIAGNOSIS — H43813 Vitreous degeneration, bilateral: Secondary | ICD-10-CM | POA: Diagnosis not present

## 2018-01-10 DIAGNOSIS — H401233 Low-tension glaucoma, bilateral, severe stage: Secondary | ICD-10-CM | POA: Diagnosis not present

## 2018-01-10 DIAGNOSIS — H25013 Cortical age-related cataract, bilateral: Secondary | ICD-10-CM | POA: Diagnosis not present

## 2018-03-12 DIAGNOSIS — G5621 Lesion of ulnar nerve, right upper limb: Secondary | ICD-10-CM | POA: Diagnosis not present

## 2018-04-04 DIAGNOSIS — G5621 Lesion of ulnar nerve, right upper limb: Secondary | ICD-10-CM | POA: Diagnosis not present

## 2018-04-17 ENCOUNTER — Other Ambulatory Visit: Payer: Self-pay | Admitting: Cardiovascular Disease

## 2018-04-18 ENCOUNTER — Telehealth: Payer: Self-pay | Admitting: *Deleted

## 2018-04-18 NOTE — Telephone Encounter (Signed)
ATTEMPTED TO LEAVE VM  ON HOME PHONE AND WIFE NUMBER NO VM STEP . PHONE JUST HANGS UP

## 2018-04-19 ENCOUNTER — Telehealth: Payer: Self-pay | Admitting: *Deleted

## 2018-04-19 NOTE — Telephone Encounter (Signed)
ERROR

## 2018-04-19 NOTE — Telephone Encounter (Signed)
.   Virtual Visit Pre-Appointment Phone Call  TELEPHONE CALL NOTE  Jesus Warner has been deemed a candidate for a follow-up tele-health visit to limit community exposure during the Covid-19 pandemic. I spoke with the patient via phone to ensure availability of phone/video source, confirm preferred email & phone number, and discuss instructions and expectations.  I reminded Jesus Warner to be prepared with any vital sign and/or heart rhythm information that could potentially be obtained via home monitoring, at the time of his visit. I reminded Jesus Warner to expect a phone call at the time of his visit if his visit.  Jesus Warner, [place] 04/19/2018 2:28 PM  Confirm consent - "In the setting of the current Covid19 crisis, you are scheduled for a (phone or video) visit with your provider on (date) at (time).  Just as we do with many in-office visits, in order for you to participate in this visit, we must obtain consent.  If you'd like, I can send this to your mychart (if signed up) or email for you to review.  Otherwise, I can obtain your verbal consent now.  All virtual visits are billed to your insurance company just like a normal visit would be.  By agreeing to a virtual visit, we'd like you to understand that the technology does not allow for your provider to perform an examination, and thus may limit your provider's ability to fully assess your condition.  Finally, though the technology is pretty good, we cannot assure that it will always work on either your or our end, and in the setting of a video visit, we may have to convert it to a phone-only visit.  In either situation, we cannot ensure that we have a secure connection.  Are you willing to proceed?" STAFF: Did the patient verbally acknowledge consent to telehealth visit? Document YES/NO here: YES    FULL LENGTH CONSENT FOR TELE-HEALTH VISIT   I hereby voluntarily request, consent and authorize [facility] and its employed or  contracted physicians, physician assistants, nurse practitioners or other licensed health care professionals (the Practitioner), to provide me with telemedicine health care services (the "Services") as deemed necessary by the treating Practitioner. I acknowledge and consent to receive the Services by the Practitioner via telemedicine. I understand that the telemedicine visit will involve communicating with the Practitioner through live audiovisual communication technology and the disclosure of certain medical information by electronic transmission. I acknowledge that I have been given the opportunity to request an in-person assessment or other available alternative prior to the telemedicine visit and am voluntarily participating in the telemedicine visit.  I understand that I have the right to withhold or withdraw my consent to the use of telemedicine in the course of my care at any time, without affecting my right to future care or treatment, and that the Practitioner or I may terminate the telemedicine visit at any time. I understand that I have the right to inspect all information obtained and/or recorded in the course of the telemedicine visit and may receive copies of available information for a reasonable fee.  I understand that some of the potential risks of receiving the Services via telemedicine include:  Marland Kitchen Delay or interruption in medical evaluation due to technological equipment failure or disruption; . Information transmitted may not be sufficient (e.g. poor resolution of images) to allow for appropriate medical decision making by the Practitioner; and/or  . In rare instances, security protocols could fail, causing a breach of personal health  information.  Furthermore, I acknowledge that it is my responsibility to provide information about my medical history, conditions and care that is complete and accurate to the best of my ability. I acknowledge that Practitioner's advice, recommendations,  and/or decision may be based on factors not within their control, such as incomplete or inaccurate data provided by me or distortions of diagnostic images or specimens that may result from electronic transmissions. I understand that the practice of medicine is not an exact science and that Practitioner makes no warranties or guarantees regarding treatment outcomes. I acknowledge that I will receive a copy of this consent concurrently upon execution via email to the email address I last provided but may also request a printed copy by calling the office of [facility].    I understand that my insurance will be billed for this visit.   I have read or had this consent read to me. . I understand the contents of this consent, which adequately explains the benefits and risks of the Services being provided via telemedicine.  . I have been provided ample opportunity to ask questions regarding this consent and the Services and have had my questions answered to my satisfaction. . I give my informed consent for the services to be provided through the use of telemedicine in my medical care  By participating in this telemedicine visit I agree to the above.

## 2018-04-20 DIAGNOSIS — W57XXXA Bitten or stung by nonvenomous insect and other nonvenomous arthropods, initial encounter: Secondary | ICD-10-CM | POA: Diagnosis not present

## 2018-04-20 DIAGNOSIS — S30861A Insect bite (nonvenomous) of abdominal wall, initial encounter: Secondary | ICD-10-CM | POA: Diagnosis not present

## 2018-04-23 ENCOUNTER — Other Ambulatory Visit: Payer: Self-pay | Admitting: Cardiovascular Disease

## 2018-04-23 NOTE — Progress Notes (Signed)
Virtual Visit via Telephone Note   This visit type was conducted due to national recommendations for restrictions regarding the COVID-19 Pandemic (e.g. social distancing) in an effort to limit this patient's exposure and mitigate transmission in our community.  Due to his co-morbid illnesses, this patient is at least at moderate risk for complications without adequate follow up.  This format is felt to be most appropriate for this patient at this time.  The patient did not have access to video technology/had technical difficulties with video requiring transitioning to audio format only (telephone).  All issues noted in this document were discussed and addressed.  No physical exam could be performed with this format.  Please refer to the patient's chart for his  consent to telehealth for [facility].   Evaluation Performed:  Follow-up visit  Date:  04/24/2018   ID:  Jesus Warner, DOB [birth year], MRN [id number]  Patient Location: Home Provider Location: Home  PCP:  Seward Carol, MD  Cardiologist:  [name], MD   Electrophysiologist:  None   Chief Complaint: Follow-up on CAD, CHF  History of Present Illness:    Jesus Warner is a 83 y.o. male with coronary artery disease, systolic CHF 2/2 ischemic CM, hypertension, hyperlipidemia, LBBB.  He presented with an anterior MI in 09/2012 that was treated with a DES to the LAD.  His EF was initially 25-30 but improved to 45-50.  He was last seen in clinic by Leanor Kail, PA-C in 04/2017.     Today, he notes he has been doing well.  He has not had chest pain, shortness of breath, syncope, paroxysmal nocturnal dyspnea, leg swelling. He remains very active without any limitations.    The patient does not have symptoms concerning for COVID-19 infection (fever, chills, cough, or new shortness of breath).    Past Medical History:  Diagnosis Date  . Arthritis   . BPH (benign prostatic hyperplasia)   . CAD (coronary artery  disease)    a. Anterior STEMI (9/14) => s/p DES-mid LAD (LHC 09/06/12: LAD occluded, after reperfusion distal LAD 40%, oCFX 80%, RCA with minimal irregularities, EF 40% with distal anterior, apical and infra-apical AK. PCI: Xience (3 x 18 mm) DES to the mid LAD. CFX was a small vessel and medical therapy was planned)  . Chronic systolic CHF (congestive heart failure) ([place])   . HTN (hypertension)   . Hyperlipidemia   . Ischemic cardiomyopathy    a.  Echocardiogram 09/06/12: EF 25-30%, mid to apical anteroseptal and inferoseptal AK, apical lateral AK, mid to apical anterior severe HK, true apex AK, grade 1 diastolic dysfunction, trivial MR, mildly reduced RVSF, PASP 38;   b. Echo (12/2012): Mild LVH, EF 45-50%, PASP 40  . Pneumonia   . Primary osteoarthritis of shoulder    Right  . Seasonal allergies    Past Surgical History:  Procedure Laterality Date  . CORONARY ANGIOPLASTY WITH STENT PLACEMENT  09/06/2012   total mid LAD occlusion s/p DES, 40% distal LAD, 80% ostial LCx (small vessel supplying single OM branch), widely patent RCA; akinesis of distal anterior, apical and inferoapical walls; EF 40%  . LEFT HEART CATHETERIZATION WITH CORONARY ANGIOGRAM N/A 09/06/2012   Procedure: LEFT HEART CATHETERIZATION WITH CORONARY ANGIOGRAM;  Surgeon: [name], MD;  Location: [facility] CATH LAB;  Service: Cardiovascular;  Laterality: N/A;  . PERCUTANEOUS CORONARY STENT INTERVENTION (PCI-S)  09/06/2012   Procedure: PERCUTANEOUS CORONARY STENT INTERVENTION (PCI-S);  Surgeon: [name], MD;  Location: [facility] CATH LAB;  Service: Cardiovascular;;  . TOTAL SHOULDER ARTHROPLASTY Right 05/25/2017   Procedure: RIGHT TOTAL SHOULDER ARTHROPLASTY;  Surgeon: Tania Ade, MD;  Location: [facility];  Service: Orthopedics;  Laterality: Right;     Current Meds  Medication Sig  . aspirin 81 MG tablet Take 81 mg by mouth daily.  Marland Kitchen atorvastatin (LIPITOR) 80 MG tablet TAKE 1 TABLET BY MOUTH EVERY DAY AT 6:00PM  . azithromycin  (ZITHROMAX) 250 MG tablet Take 250 mg by mouth as needed (for colds).  . carvedilol (COREG) 6.25 MG tablet TAKE 1 TABLET BY MOUTH TWICE A DAY WITH MEALS.  . cetirizine (ZYRTEC) 10 MG tablet Take 10 mg by mouth daily.  . diclofenac sodium (VOLTAREN) 1 % GEL Apply 4 g topically 3 (three) times daily.   . finasteride (PROSCAR) 5 MG tablet Take 5 mg by mouth daily.   . fluticasone (FLONASE) 50 MCG/ACT nasal spray Place 1 spray into both nostrils daily as needed for allergies.  Marland Kitchen latanoprost (XALATAN) 0.005 % ophthalmic solution Place 1 drop into both eyes at bedtime.  Marland Kitchen losartan (COZAAR) 50 MG tablet TAKE 1 TABLET(50 MG) BY MOUTH DAILY  . nitroGLYCERIN (NITROSTAT) 0.4 MG SL tablet Place 1 tablet (0.4 mg total) under the tongue every 5 (five) minutes x 3 doses as needed for chest pain.  Marland Kitchen oxyCODONE-acetaminophen (PERCOCET) 5-325 MG tablet Take 1-2 tablets by mouth every 4 (four) hours as needed for severe pain.  Marland Kitchen spironolactone (ALDACTONE) 25 MG tablet TAKE 1 TABLET BY MOUTH DAILY  . tamsulosin (FLOMAX) 0.4 MG CAPS capsule Take 0.4 mg by mouth every evening.     Allergies:   Patient has no known allergies.   Social History   Tobacco Use  . Smoking status: Never Smoker  . Smokeless tobacco: Never Used  Substance Use Topics  . Alcohol use: No  . Drug use: No     Family Hx: The patient's family history includes Cancer in his maternal aunt, maternal aunt, and mother; Diabetes in his mother; Emphysema in his father.  ROS:   Please see the history of present illness.    All other systems reviewed and are negative.   Prior CV studies:   The following studies were reviewed today:  Echo 04/06/15 EF 45-50, anteroseptal and apical HK, grade 1 diastolic dysfunction, PASP 32  Echocardiogram 12/12/2012 Mild LVH, EF 45-50, mildly reduced RV SF, PASP 40  Cardiac Catheterization 09/06/12 LAD mid 100; dist 40 LCx (small vessel) ostial 80 RCA mid irregs EF 40 PCI:  3 x 18 mm Xience DES to mid  LAD  Labs/Other Tests and Data Reviewed:    EKG:  No ECG reviewed.  Recent Labs: 05/16/2017: ALT 20 05/26/2017: BUN 21; Creatinine, Ser 1.47; Hemoglobin 12.5; Platelets 158; Potassium 4.7; Sodium 137   Recent Lipid Panel Lab Results  Component Value Date/Time   CHOL 166 12/23/2013 08:05 AM   TRIG 103.0 12/23/2013 08:05 AM   HDL 38.10 (L) 12/23/2013 08:05 AM   CHOLHDL 4 12/23/2013 08:05 AM   LDLCALC 107 (H) 12/23/2013 08:05 AM   From [employer] Tool    Wt Readings from Last 3 Encounters:  04/24/18 168 lb (76.2 kg)  05/25/17 170 lb 6.4 oz (77.3 kg)  05/16/17 170 lb 6.4 oz (77.3 kg)     Objective:    Vital Signs:  BP 98/61   Pulse 60   Ht 5\' 10"  (1.778 m)   Wt 168 lb (76.2 kg)   BMI 24.11 kg/m    VITAL SIGNS:  reviewed GEN:  no acute distress PSYCH:  He seems to be in good spirits  ASSESSMENT & PLAN:    Coronary artery disease involving native coronary artery of native heart without angina pectoris History of anterior MI in 2014 treated with a drug-eluting stent to the LAD.  He is currently doing well without anginal symptoms.  Continue aspirin, atorvastatin.  Chronic systolic CHF (congestive heart failure) (HCC) EF 45-50 by echocardiogram in April 2017.  NYHA 2.  Volume status seems to be stable.  Continue beta-blocker, ARB, spironolactone.  Essential hypertension Well controlled.  His systolic pressure is 98.  He is not symptomatic with this.  I have asked him to continue to monitor his blood pressure and notify us if his readings are lower or if he becomes symptomatic.  Hyperlipidemia LDL goal <70 LDL optimal on most recent lab work.  Continue current Rx.    COVID-19 Education: The signs and symptoms of COVID-19 were discussed with the patient and how to seek care for testing (follow up with PCP or arrange E-visit).  The importance of social distancing was discussed today.  Time:   Today, I have spent 15 minutes with the patient with telehealth technology discussing  the above problems.     Medication Adjustments/Labs and Tests Ordered: Current medicines are reviewed at length with the patient today.  Concerns regarding medicines are outlined above.   Tests Ordered: No orders of the defined types were placed in this encounter.   Medication Changes: No orders of the defined types were placed in this encounter.   Disposition:  Follow up in 1 year(s)  Signed, Richardson Dopp, PA-C  04/24/2018 1:53 PM     [facility]

## 2018-04-24 ENCOUNTER — Other Ambulatory Visit: Payer: Self-pay

## 2018-04-24 ENCOUNTER — Encounter: Payer: Self-pay | Admitting: Physician Assistant

## 2018-04-24 ENCOUNTER — Telehealth (INDEPENDENT_AMBULATORY_CARE_PROVIDER_SITE_OTHER): Payer: Medicare Other | Admitting: Physician Assistant

## 2018-04-24 VITALS — BP 98/61 | HR 60 | Ht 70.0 in | Wt 168.0 lb

## 2018-04-24 DIAGNOSIS — I2510 Atherosclerotic heart disease of native coronary artery without angina pectoris: Secondary | ICD-10-CM | POA: Diagnosis not present

## 2018-04-24 DIAGNOSIS — I5022 Chronic systolic (congestive) heart failure: Secondary | ICD-10-CM

## 2018-04-24 DIAGNOSIS — E785 Hyperlipidemia, unspecified: Secondary | ICD-10-CM

## 2018-04-24 DIAGNOSIS — Z7189 Other specified counseling: Secondary | ICD-10-CM

## 2018-04-24 DIAGNOSIS — I10 Essential (primary) hypertension: Secondary | ICD-10-CM

## 2018-04-24 NOTE — Patient Instructions (Addendum)
Medication Instructions:  No changes  If you need a refill on your cardiac medications before your next appointment, please call your pharmacy.   Lab work: None   If you have labs (blood work) drawn today and your tests are completely normal, you will receive your results only by: Marland Kitchen MyChart Message (if you have MyChart) OR . A paper copy in the mail If you have any lab test that is abnormal or we need to change your treatment, we will call you to review the results.  Testing/Procedures: None   Follow-Up: At [facility], you and your health needs are our priority.  As part of our continuing mission to provide you with exceptional heart care, we have created designated Provider Care Teams.  These Care Teams include your primary Cardiologist (physician) and Advanced Practice Providers (APPs -  Physician Assistants and Nurse Practitioners) who all work together to provide you with the care you need, when you need it. You will need a follow up appointment in:  12 months.  Please call our office 2 months in advance to schedule this appointment.  You may see [name], MD or Richardson Dopp, PA-C  Any Other Special Instructions Will Be Listed Below (If Applicable).   Call if your top number on your blood pressure is 90 or less or if you start to feel lightheaded, dizzy, weak

## 2018-04-28 ENCOUNTER — Other Ambulatory Visit: Payer: Self-pay | Admitting: Cardiovascular Disease

## 2018-05-21 DIAGNOSIS — H401233 Low-tension glaucoma, bilateral, severe stage: Secondary | ICD-10-CM | POA: Diagnosis not present

## 2018-07-20 DIAGNOSIS — L986 Other infiltrative disorders of the skin and subcutaneous tissue: Secondary | ICD-10-CM | POA: Diagnosis not present

## 2018-07-20 DIAGNOSIS — L570 Actinic keratosis: Secondary | ICD-10-CM | POA: Diagnosis not present

## 2018-07-20 DIAGNOSIS — X32XXXD Exposure to sunlight, subsequent encounter: Secondary | ICD-10-CM | POA: Diagnosis not present

## 2018-09-12 DIAGNOSIS — C8400 Mycosis fungoides, unspecified site: Secondary | ICD-10-CM | POA: Diagnosis not present

## 2018-10-02 DIAGNOSIS — H401233 Low-tension glaucoma, bilateral, severe stage: Secondary | ICD-10-CM | POA: Diagnosis not present

## 2018-10-05 IMAGING — CT CT SHOULDER*R* W/O CM
1 series · 12 of 14 positions shown, 15 images · non-contrast
Comparison: None.

CLINICAL DATA: Right shoulder osteoarthritis. Surgical planning for
replacement.

EXAM:
CT OF THE UPPER RIGHT EXTREMITY WITHOUT CONTRAST
TECHNIQUE: Multidetector CT imaging of the upper right extremity was performed
according to the standard protocol.

[Series 2: soft tissue · axial · 0.48mm/px · z∈[-407,-211]mm · 12 of 116 slices shown, 15 images]
[im 9/116  soft-tissue]
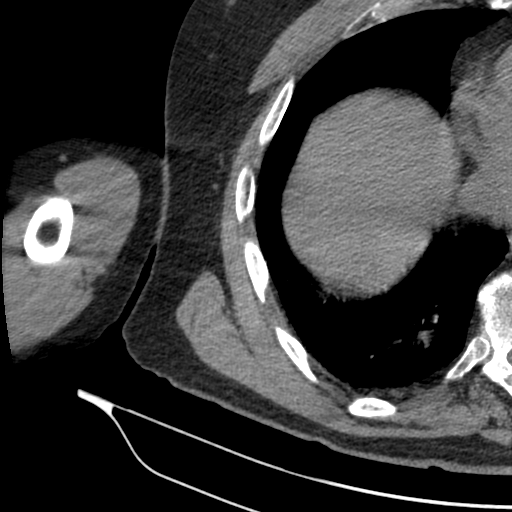
[im 9/116  bone]
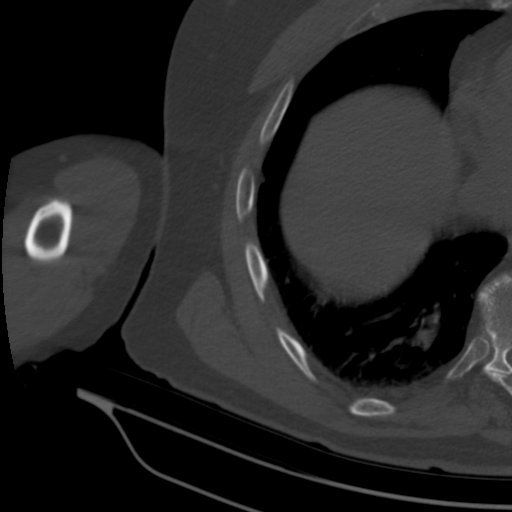
[im 18/116  bone]
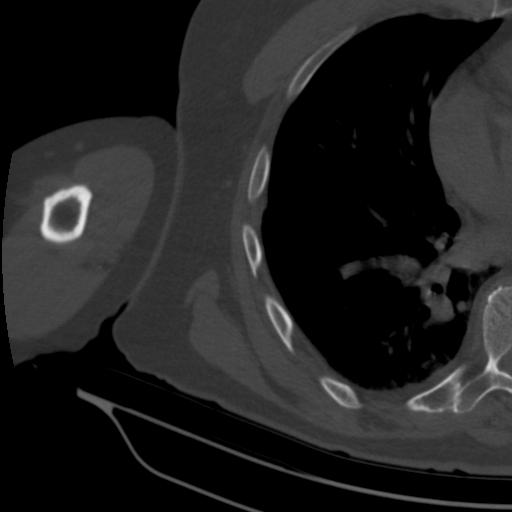
[im 27/116  bone]
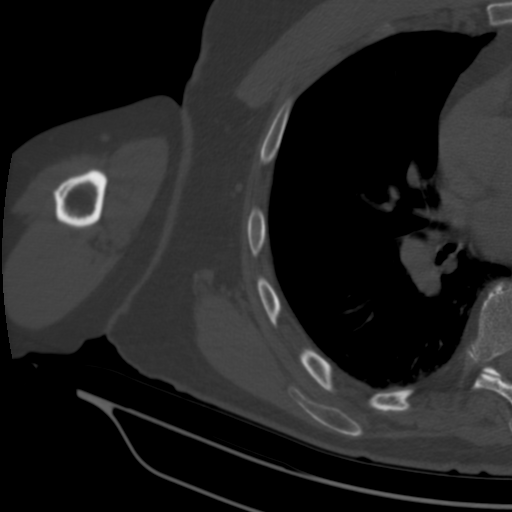
[im 36/116  bone]
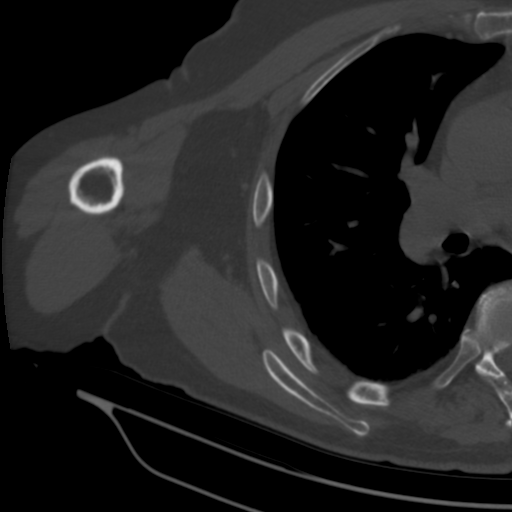
[im 45/116  soft-tissue]
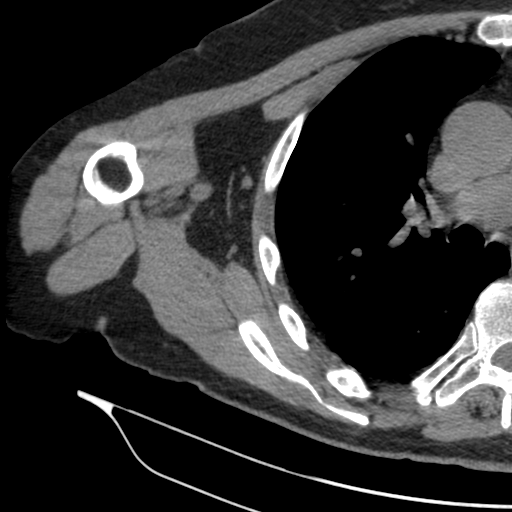
[im 45/116  bone]
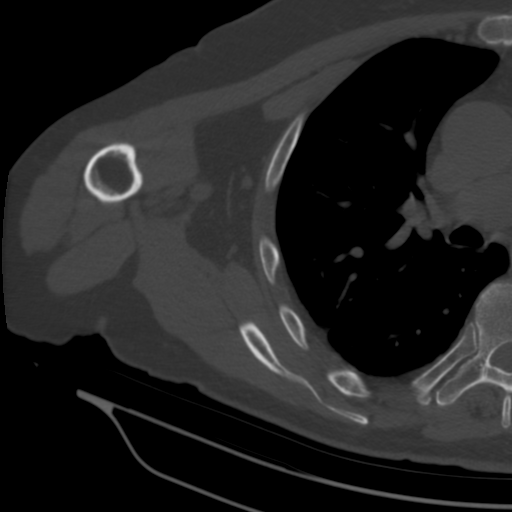
[im 54/116  bone]
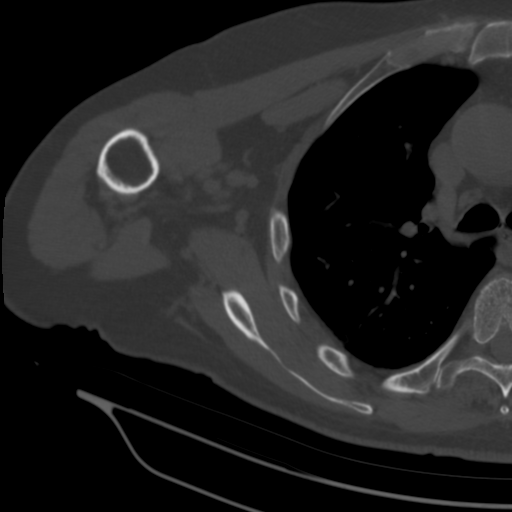
[im 62/116  bone]
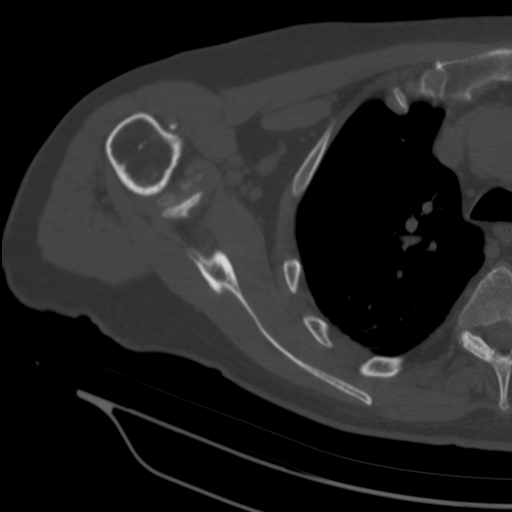
[im 71/116  bone]
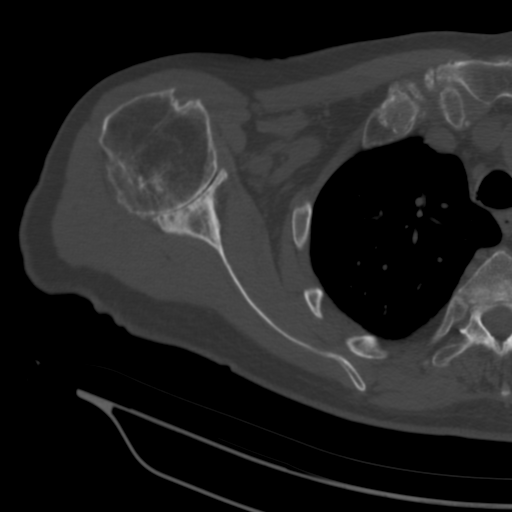
[im 80/116  soft-tissue]
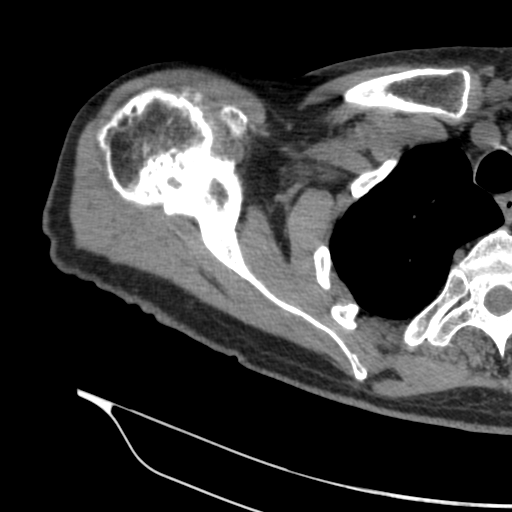
[im 80/116  bone]
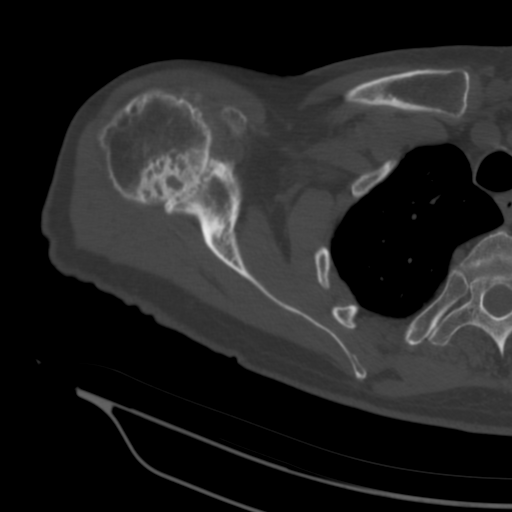
[im 89/116  bone]
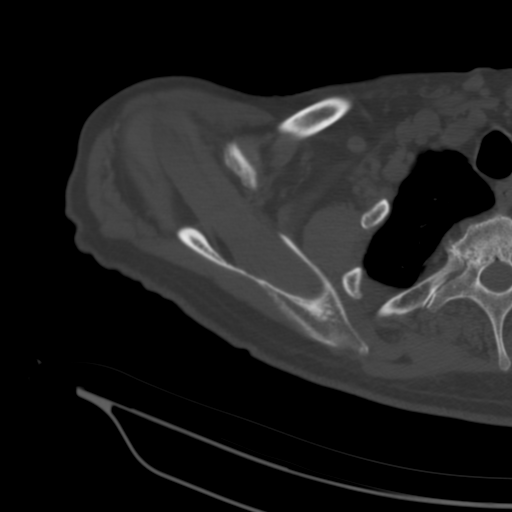
[im 98/116  bone]
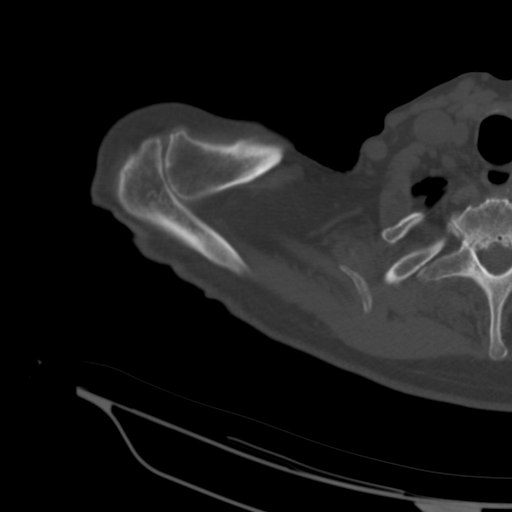
[im 107/116  bone]
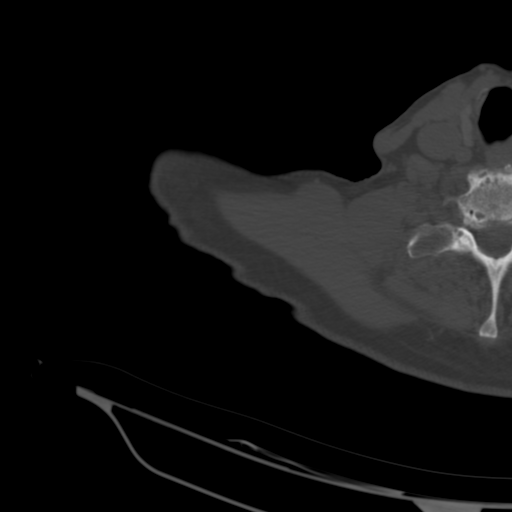

[12 of 14 positions shown; findings below may reference images not displayed]

FINDINGS: Bones/Joint/Cartilage

No fracture or dislocation. Normal alignment. No joint effusion.

Severe osteoarthritis of the glenohumeral joint with severe joint
space narrowing, bone-on-bone appearance, subchondral sclerosis,
subchondral cystic changes and marginal osteophytosis. Bony
remodeling of the glenoid.

Moderate arthropathy of the acromioclavicular joint. Type I
acromion.

Ligaments

Ligaments are suboptimally evaluated by CT.

Muscles and Tendons
Muscles are normal. No muscle atrophy. Rotator cuff is grossly
intact. Amorphous calcification within the biceps tendon and distal
supraspinatus and infraspinatus tendons.

Soft tissue
No fluid collection or hematoma. No soft tissue mass. 3 mm nodule in
the right middle lobe (series 2, image 105). Punctate calcified
granuloma in the right upper lobe.
IMPRESSION: 1. Severe osteoarthritis of the glenohumeral joint.
2. Moderate osteoarthritis of the acromioclavicular joint.
3. Amorphous calcification within the biceps and distal
supraspinatus and infraspinatus tendons may reflect calcific
tendinitis.

## 2018-10-15 ENCOUNTER — Other Ambulatory Visit: Payer: Self-pay | Admitting: Cardiovascular Disease

## 2018-10-16 ENCOUNTER — Other Ambulatory Visit: Payer: Self-pay | Admitting: Cardiovascular Disease

## 2018-10-22 ENCOUNTER — Other Ambulatory Visit: Payer: Self-pay | Admitting: Cardiovascular Disease

## 2018-12-18 DIAGNOSIS — R351 Nocturia: Secondary | ICD-10-CM | POA: Diagnosis not present

## 2018-12-18 DIAGNOSIS — N401 Enlarged prostate with lower urinary tract symptoms: Secondary | ICD-10-CM | POA: Diagnosis not present

## 2019-02-01 DIAGNOSIS — H25013 Cortical age-related cataract, bilateral: Secondary | ICD-10-CM | POA: Diagnosis not present

## 2019-02-01 DIAGNOSIS — H2513 Age-related nuclear cataract, bilateral: Secondary | ICD-10-CM | POA: Diagnosis not present

## 2019-02-01 DIAGNOSIS — H401233 Low-tension glaucoma, bilateral, severe stage: Secondary | ICD-10-CM | POA: Diagnosis not present

## 2019-03-13 DIAGNOSIS — L820 Inflamed seborrheic keratosis: Secondary | ICD-10-CM | POA: Diagnosis not present

## 2019-03-13 DIAGNOSIS — C8400 Mycosis fungoides, unspecified site: Secondary | ICD-10-CM | POA: Diagnosis not present

## 2019-04-15 DIAGNOSIS — N400 Enlarged prostate without lower urinary tract symptoms: Secondary | ICD-10-CM | POA: Diagnosis not present

## 2019-04-15 DIAGNOSIS — I2510 Atherosclerotic heart disease of native coronary artery without angina pectoris: Secondary | ICD-10-CM | POA: Diagnosis not present

## 2019-04-15 DIAGNOSIS — E785 Hyperlipidemia, unspecified: Secondary | ICD-10-CM | POA: Diagnosis not present

## 2019-04-15 DIAGNOSIS — I10 Essential (primary) hypertension: Secondary | ICD-10-CM | POA: Diagnosis not present

## 2019-04-16 ENCOUNTER — Other Ambulatory Visit: Payer: Self-pay | Admitting: Cardiovascular Disease

## 2019-04-16 DIAGNOSIS — S50311A Abrasion of right elbow, initial encounter: Secondary | ICD-10-CM | POA: Diagnosis not present

## 2019-04-16 DIAGNOSIS — S20211A Contusion of right front wall of thorax, initial encounter: Secondary | ICD-10-CM | POA: Diagnosis not present

## 2019-04-16 DIAGNOSIS — R0781 Pleurodynia: Secondary | ICD-10-CM | POA: Diagnosis not present

## 2019-04-16 DIAGNOSIS — W19XXXA Unspecified fall, initial encounter: Secondary | ICD-10-CM | POA: Diagnosis not present

## 2019-04-16 DIAGNOSIS — W1839XA Other fall on same level, initial encounter: Secondary | ICD-10-CM | POA: Diagnosis not present

## 2019-05-02 DIAGNOSIS — Z0000 Encounter for general adult medical examination without abnormal findings: Secondary | ICD-10-CM | POA: Diagnosis not present

## 2019-05-02 DIAGNOSIS — I10 Essential (primary) hypertension: Secondary | ICD-10-CM | POA: Diagnosis not present

## 2019-05-02 DIAGNOSIS — E7800 Pure hypercholesterolemia, unspecified: Secondary | ICD-10-CM | POA: Diagnosis not present

## 2019-05-02 DIAGNOSIS — Z1389 Encounter for screening for other disorder: Secondary | ICD-10-CM | POA: Diagnosis not present

## 2019-05-06 ENCOUNTER — Ambulatory Visit: Payer: Medicare Other | Admitting: Cardiovascular Disease

## 2019-05-06 ENCOUNTER — Encounter: Payer: Self-pay | Admitting: Cardiovascular Disease

## 2019-05-06 ENCOUNTER — Other Ambulatory Visit: Payer: Self-pay

## 2019-05-06 VITALS — BP 112/66 | HR 57 | Ht 70.0 in | Wt 163.0 lb

## 2019-05-06 DIAGNOSIS — I5022 Chronic systolic (congestive) heart failure: Secondary | ICD-10-CM

## 2019-05-06 DIAGNOSIS — I2510 Atherosclerotic heart disease of native coronary artery without angina pectoris: Secondary | ICD-10-CM

## 2019-05-06 DIAGNOSIS — I10 Essential (primary) hypertension: Secondary | ICD-10-CM

## 2019-05-06 DIAGNOSIS — E782 Mixed hyperlipidemia: Secondary | ICD-10-CM

## 2019-05-06 NOTE — Patient Instructions (Signed)
Medication Instructions:  Your provider recommends that you continue on your current medications as directed. Please refer to the Current Medication list given to you today.   *If you need a refill on your cardiac medications before your next appointment, please call your pharmacy*  Testing/Procedures: Your provider has requested that you have an echocardiogram. Echocardiography is a painless test that uses sound waves to create images of your heart. It provides your doctor with information about the size and shape of your heart and how well your heart's chambers and valves are working. This procedure takes approximately one hour. There are no restrictions for this procedure.      Follow-Up: At [facility], you and your health needs are our priority.  As part of our continuing mission to provide you with exceptional heart care, we have created designated Provider Care Teams.  These Care Teams include your primary Cardiologist (physician) and Advanced Practice Providers (APPs -  Physician Assistants and Nurse Practitioners) who all work together to provide you with the care you need, when you need it. Your next appointment:   12 month(s) The format for your next appointment:   In Person Provider:   You may see Michael Cooper, MD or one of the following Advanced Practice Providers on your designated Care Team:    Scott Weaver, PA-C  Vin Bhagat, PA-C   

## 2019-05-06 NOTE — Progress Notes (Signed)
Cardiology Office Note:    Date:  05/06/2019   ID:  Jesus Warner, DOB 21-Feb-1935, MRN [id number]  PCP:  Seward Carol, MD  Cardiologist:  [name], MD  Electrophysiologist:  None   Referring MD: Seward Carol, MD   Chief Complaint  Patient presents with  . Coronary Artery Disease    History of Present Illness:    Jesus Warner is a 84 y.o. male with a hx of coronary artery disease, systolic CHF 2/2 ischemic CM, hypertension, hyperlipidemia, LBBB.  He presented with an anterior MI in 09/2012 that was treated with a DES to the LAD.  His EF was initially 25-30 but improved to 45-50.   Patient is here alone today.  He has been doing very well from a cardiac perspective.  He used to exercise regularly, but now just remains active throughout his day with his normal activities.  He denies chest pain, chest pressure, shortness of breath, heart palpitations, orthopnea, or PND.  He has had no lightheadedness or syncope.  He tolerates his medicines well without any obvious side effects per his report.  Past Medical History:  Diagnosis Date  . Arthritis   . BPH (benign prostatic hyperplasia)   . CAD (coronary artery disease)    a. Anterior STEMI (9/14) => s/p DES-mid LAD (LHC 09/06/12: LAD occluded, after reperfusion distal LAD 40%, oCFX 80%, RCA with minimal irregularities, EF 40% with distal anterior, apical and infra-apical AK. PCI: Xience (3 x 18 mm) DES to the mid LAD. CFX was a small vessel and medical therapy was planned)  . Chronic systolic CHF (congestive heart failure) ([facility])   . HTN (hypertension)   . Hyperlipidemia   . Ischemic cardiomyopathy    a.  Echocardiogram 09/06/12: EF 25-30%, mid to apical anteroseptal and inferoseptal AK, apical lateral AK, mid to apical anterior severe HK, true apex AK, grade 1 diastolic dysfunction, trivial MR, mildly reduced RVSF, PASP 38;   b. Echo (12/2012): Mild LVH, EF 45-50%, PASP 40  . Pneumonia   . Primary osteoarthritis of shoulder    Right  . Seasonal allergies     Past Surgical History:  Procedure Laterality Date  . CORONARY ANGIOPLASTY WITH STENT PLACEMENT  09/06/2012   total mid LAD occlusion s/p DES, 40% distal LAD, 80% ostial LCx (small vessel supplying single OM branch), widely patent RCA; akinesis of distal anterior, apical and inferoapical walls; EF 40%  . LEFT HEART CATHETERIZATION WITH CORONARY ANGIOGRAM N/A 09/06/2012   Procedure: LEFT HEART CATHETERIZATION WITH CORONARY ANGIOGRAM;  Surgeon: [name], MD;  Location: [facility] CATH LAB;  Service: Cardiovascular;  Laterality: N/A;  . PERCUTANEOUS CORONARY STENT INTERVENTION (PCI-S)  09/06/2012   Procedure: PERCUTANEOUS CORONARY STENT INTERVENTION (PCI-S);  Surgeon: [name], MD;  Location: [facility] CATH LAB;  Service: Cardiovascular;;  . TOTAL SHOULDER ARTHROPLASTY Right 05/25/2017   Procedure: RIGHT TOTAL SHOULDER ARTHROPLASTY;  Surgeon: Tania Ade, MD;  Location: [place];  Service: Orthopedics;  Laterality: Right;    Current Medications: Current Meds  Medication Sig  . aspirin 81 MG tablet Take 81 mg by mouth daily.  Marland Kitchen atorvastatin (LIPITOR) 80 MG tablet Take 1 tablet (80 mg total) by mouth daily. Pt must make appt with provider for further refills  . azithromycin (ZITHROMAX) 250 MG tablet Take 250 mg by mouth as needed (for colds).  . carvedilol (COREG) 6.25 MG tablet TAKE 1 TABLET BY MOUTH TWICE DAILY WITH MEALS  . cetirizine (ZYRTEC) 10 MG tablet Take 10 mg by mouth daily.  Marland Kitchen  diclofenac sodium (VOLTAREN) 1 % GEL Apply 4 g topically 3 (three) times daily.   . finasteride (PROSCAR) 5 MG tablet Take 5 mg by mouth daily.   . fluticasone (FLONASE) 50 MCG/ACT nasal spray Place 1 spray into both nostrils daily as needed for allergies.  Marland Kitchen latanoprost (XALATAN) 0.005 % ophthalmic solution Place 1 drop into both eyes at bedtime.  Marland Kitchen losartan (COZAAR) 50 MG tablet Take 1 tablet (50 mg total) by mouth daily. Pt must make appt with provider for further refills  .  nitroGLYCERIN (NITROSTAT) 0.4 MG SL tablet Place 1 tablet (0.4 mg total) under the tongue every 5 (five) minutes x 3 doses as needed for chest pain.  Marland Kitchen oxyCODONE-acetaminophen (PERCOCET) 5-325 MG tablet Take 1-2 tablets by mouth every 4 (four) hours as needed for severe pain.  Marland Kitchen spironolactone (ALDACTONE) 25 MG tablet TAKE 1 TABLET BY MOUTH DAILY  . tamsulosin (FLOMAX) 0.4 MG CAPS capsule Take 0.4 mg by mouth every evening.     Allergies:   Patient has no known allergies.   Social History   Socioeconomic History  . Marital status: Married    Spouse name: Not on file  . Number of children: Not on file  . Years of education: Not on file  . Highest education level: Not on file  Occupational History  . Not on file  Tobacco Use  . Smoking status: Never Smoker  . Smokeless tobacco: Never Used  Substance and Sexual Activity  . Alcohol use: No  . Drug use: No  . Sexual activity: Not on file  Other Topics Concern  . Not on file  Social History Narrative   Lives at home with wife in [place] Strain:   . Difficulty of Paying Living Expenses:   Food Insecurity:   . Worried About Charity fundraiser in the Last Year:   . Arboriculturist in the Last Year:   Transportation Needs:   . Film/video editor (Medical):   Marland Kitchen Lack of Transportation (Non-Medical):   Physical Activity:   . Days of Exercise per Week:   . Minutes of Exercise per Session:   Stress:   . Feeling of Stress :   Social Connections:   . Frequency of Communication with Friends and Family:   . Frequency of Social Gatherings with Friends and Family:   . Attends Religious Services:   . Active Member of Clubs or Organizations:   . Attends Archivist Meetings:   Marland Kitchen Marital Status:      Family History: The patient's family history includes Cancer in his maternal aunt, maternal aunt, and mother; Diabetes in his mother; Emphysema in his father.  ROS:    Please see the history of present illness.    All other systems reviewed and are negative.  EKGs/Labs/Other Studies Reviewed:    The following studies were reviewed today: 2D echocardiogram 04/06/2015: Study Conclusions   - Left ventricle: The cavity size was normal. Systolic function was  mildly to moderately reduced. The estimated ejection fraction was  in the range of 40% to 45%. Hypokinesis of the  mid-apicalanteroseptal and apical myocardium. Doppler parameters  are consistent with abnormal left ventricular relaxation (grade 1  diastolic dysfunction).  - Pulmonary arteries: PA peak pressure: 32 mm Hg (S).   EKG:  EKG is ordered today.  The ekg ordered today demonstrates sinus bradycardia 57 bpm, first-degree AV block, left bundle branch block,  no significant change from prior tracings.  Recent Labs: No results found for requested labs within last 8760 hours.  Recent Lipid Panel    Component Value Date/Time   CHOL 166 12/23/2013 0805   TRIG 103.0 12/23/2013 0805   HDL 38.10 (L) 12/23/2013 0805   CHOLHDL 4 12/23/2013 0805   VLDL 20.6 12/23/2013 0805   LDLCALC 107 (H) 12/23/2013 0805    Physical Exam:    VS:  BP 112/66   Pulse (!) 57   Ht 5\' 10"  (1.778 m)   Wt 163 lb (73.9 kg)   SpO2 97%   BMI 23.39 kg/m     Wt Readings from Last 3 Encounters:  05/06/19 163 lb (73.9 kg)  04/24/18 168 lb (76.2 kg)  05/25/17 170 lb 6.4 oz (77.3 kg)     GEN:  Well nourished, well developed in no acute distress HEENT: Normal NECK: No JVD; No carotid bruits LYMPHATICS: No lymphadenopathy CARDIAC: RRR, no murmurs, rubs, gallops RESPIRATORY:  Clear to auscultation without rales, wheezing or rhonchi  ABDOMEN: Soft, non-tender, non-distended MUSCULOSKELETAL:  No edema; No deformity  SKIN: Warm and dry NEUROLOGIC:  Alert and oriented x 3 PSYCHIATRIC:  Normal affect   ASSESSMENT:    1. Chronic systolic heart failure ([place])   2. Coronary artery disease involving native  coronary artery of native heart without angina pectoris   3. Mixed hyperlipidemia   4. Essential hypertension    PLAN:    In order of problems listed above:  1. The patient has New York Heart Association functional class I symptoms.  He is doing well on his current medical program which includes carvedilol, losartan, and spironolactone.  Will reassess LV function with an echocardiogram as it has been 4 years since his last study. 2. Continues on aspirin and a high intensity statin drug.  No anginal symptoms. 3. Most recent lipids reviewed with LDL cholesterol 78 mg/dL.  He continues on atorvastatin 80 mg. 4. Blood pressure is well controlled on losartan, spironolactone, and carvedilol.  No changes recommended today.   Medication Adjustments/Labs and Tests Ordered: Current medicines are reviewed at length with the patient today.  Concerns regarding medicines are outlined above.  Orders Placed This Encounter  Procedures  . EKG 12-Lead  . ECHOCARDIOGRAM COMPLETE   No orders of the defined types were placed in this encounter.   Patient Instructions  Medication Instructions:  Your provider recommends that you continue on your current medications as directed. Please refer to the Current Medication list given to you today.   *If you need a refill on your cardiac medications before your next appointment, please call your pharmacy*  Testing/Procedures: Your provider has requested that you have an echocardiogram. Echocardiography is a painless test that uses sound waves to create images of your heart. It provides your doctor with information about the size and shape of your heart and how well your heart's chambers and valves are working. This procedure takes approximately one hour. There are no restrictions for this procedure.  Follow-Up: At [facility], you and your health needs are our priority.  As part of our continuing mission to provide you with exceptional heart care, we have created  designated Provider Care Teams.  These Care Teams include your primary Cardiologist (physician) and Advanced Practice Providers (APPs -  Physician Assistants and Nurse Practitioners) who all work together to provide you with the care you need, when you need it. Your next appointment:   12 month(s) The format for your next  appointment:   In Person Provider:   You may see [name], MD or one of the following Advanced Practice Providers on your designated Care Team:    Richardson Dopp, PA-C  Robbie Lis, [place]      Signed, [name], MD  05/06/2019 1:32 PM    [place]

## 2019-07-05 ENCOUNTER — Other Ambulatory Visit: Payer: Self-pay | Admitting: Internal Medicine

## 2019-07-05 ENCOUNTER — Ambulatory Visit
Admission: RE | Admit: 2019-07-05 | Discharge: 2019-07-05 | Disposition: A | Discharge status: Home / Self Care | Payer: Medicare Other | Source: Ambulatory Visit | Attending: Internal Medicine | Admitting: Internal Medicine

## 2019-07-05 DIAGNOSIS — R059 Cough, unspecified: Secondary | ICD-10-CM

## 2019-07-05 DIAGNOSIS — R05 Cough: Secondary | ICD-10-CM | POA: Diagnosis not present

## 2019-07-05 DIAGNOSIS — J329 Chronic sinusitis, unspecified: Secondary | ICD-10-CM | POA: Diagnosis not present

## 2019-07-12 ENCOUNTER — Other Ambulatory Visit: Payer: Self-pay | Admitting: Cardiovascular Disease

## 2019-07-15 ENCOUNTER — Ambulatory Visit (HOSPITAL_COMMUNITY): Payer: Medicare Other | Attending: Cardiology

## 2019-07-15 ENCOUNTER — Other Ambulatory Visit: Payer: Self-pay

## 2019-07-15 DIAGNOSIS — I5022 Chronic systolic (congestive) heart failure: Secondary | ICD-10-CM

## 2019-08-02 DIAGNOSIS — H401233 Low-tension glaucoma, bilateral, severe stage: Secondary | ICD-10-CM | POA: Diagnosis not present

## 2019-09-02 ENCOUNTER — Other Ambulatory Visit: Payer: Self-pay

## 2019-09-02 MED ORDER — NITROGLYCERIN 0.4 MG SL SUBL: 0.4000 mg | SUBLINGUAL_TABLET | SUBLINGUAL | 6 refills | Status: AC | PRN

## 2019-09-02 NOTE — Telephone Encounter (Signed)
Pt's medication was sent to pt's pharmacy as requested. Confirmation received.  °

## 2019-10-12 ENCOUNTER — Other Ambulatory Visit: Payer: Self-pay | Admitting: Cardiovascular Disease

## 2019-10-25 ENCOUNTER — Other Ambulatory Visit: Payer: Self-pay | Admitting: Cardiovascular Disease

## 2019-11-01 DIAGNOSIS — I10 Essential (primary) hypertension: Secondary | ICD-10-CM | POA: Diagnosis not present

## 2019-11-01 DIAGNOSIS — E785 Hyperlipidemia, unspecified: Secondary | ICD-10-CM | POA: Diagnosis not present

## 2019-11-01 DIAGNOSIS — E7800 Pure hypercholesterolemia, unspecified: Secondary | ICD-10-CM | POA: Diagnosis not present

## 2019-11-01 DIAGNOSIS — I2510 Atherosclerotic heart disease of native coronary artery without angina pectoris: Secondary | ICD-10-CM | POA: Diagnosis not present

## 2020-01-10 DIAGNOSIS — I5022 Chronic systolic (congestive) heart failure: Secondary | ICD-10-CM | POA: Diagnosis not present

## 2020-01-10 DIAGNOSIS — I10 Essential (primary) hypertension: Secondary | ICD-10-CM | POA: Diagnosis not present

## 2020-01-10 DIAGNOSIS — E7800 Pure hypercholesterolemia, unspecified: Secondary | ICD-10-CM | POA: Diagnosis not present

## 2020-01-10 DIAGNOSIS — E785 Hyperlipidemia, unspecified: Secondary | ICD-10-CM | POA: Diagnosis not present

## 2020-01-14 DIAGNOSIS — U071 COVID-19: Secondary | ICD-10-CM | POA: Diagnosis not present

## 2020-01-14 DIAGNOSIS — R5383 Other fatigue: Secondary | ICD-10-CM | POA: Diagnosis not present

## 2020-01-29 DIAGNOSIS — H43813 Vitreous degeneration, bilateral: Secondary | ICD-10-CM | POA: Diagnosis not present

## 2020-01-29 DIAGNOSIS — H401233 Low-tension glaucoma, bilateral, severe stage: Secondary | ICD-10-CM | POA: Diagnosis not present

## 2020-01-29 DIAGNOSIS — H25013 Cortical age-related cataract, bilateral: Secondary | ICD-10-CM | POA: Diagnosis not present

## 2020-01-29 DIAGNOSIS — H2513 Age-related nuclear cataract, bilateral: Secondary | ICD-10-CM | POA: Diagnosis not present

## 2020-02-21 DIAGNOSIS — L570 Actinic keratosis: Secondary | ICD-10-CM | POA: Diagnosis not present

## 2020-02-21 DIAGNOSIS — X32XXXD Exposure to sunlight, subsequent encounter: Secondary | ICD-10-CM | POA: Diagnosis not present

## 2020-03-04 DIAGNOSIS — N401 Enlarged prostate with lower urinary tract symptoms: Secondary | ICD-10-CM | POA: Diagnosis not present

## 2020-03-04 DIAGNOSIS — R351 Nocturia: Secondary | ICD-10-CM | POA: Diagnosis not present

## 2020-03-26 DIAGNOSIS — E7800 Pure hypercholesterolemia, unspecified: Secondary | ICD-10-CM | POA: Diagnosis not present

## 2020-03-26 DIAGNOSIS — E785 Hyperlipidemia, unspecified: Secondary | ICD-10-CM | POA: Diagnosis not present

## 2020-03-26 DIAGNOSIS — I10 Essential (primary) hypertension: Secondary | ICD-10-CM | POA: Diagnosis not present

## 2020-03-26 DIAGNOSIS — I2510 Atherosclerotic heart disease of native coronary artery without angina pectoris: Secondary | ICD-10-CM | POA: Diagnosis not present

## 2020-04-22 ENCOUNTER — Other Ambulatory Visit: Payer: Self-pay

## 2020-04-22 ENCOUNTER — Ambulatory Visit
Admission: RE | Admit: 2020-04-22 | Discharge: 2020-04-22 | Disposition: A | Discharge status: Home / Self Care | Payer: Medicare Other | Source: Ambulatory Visit | Attending: Internal Medicine | Admitting: Internal Medicine

## 2020-04-22 ENCOUNTER — Other Ambulatory Visit: Payer: Self-pay | Admitting: Internal Medicine

## 2020-04-22 DIAGNOSIS — M542 Cervicalgia: Secondary | ICD-10-CM

## 2020-04-29 DIAGNOSIS — M542 Cervicalgia: Secondary | ICD-10-CM | POA: Diagnosis not present

## 2020-05-04 DIAGNOSIS — I10 Essential (primary) hypertension: Secondary | ICD-10-CM | POA: Diagnosis not present

## 2020-05-04 DIAGNOSIS — E785 Hyperlipidemia, unspecified: Secondary | ICD-10-CM | POA: Diagnosis not present

## 2020-05-04 DIAGNOSIS — I130 Hypertensive heart and chronic kidney disease with heart failure and stage 1 through stage 4 chronic kidney disease, or unspecified chronic kidney disease: Secondary | ICD-10-CM | POA: Diagnosis not present

## 2020-05-04 DIAGNOSIS — E7800 Pure hypercholesterolemia, unspecified: Secondary | ICD-10-CM | POA: Diagnosis not present

## 2020-05-11 DIAGNOSIS — M542 Cervicalgia: Secondary | ICD-10-CM | POA: Diagnosis not present

## 2020-07-24 ENCOUNTER — Other Ambulatory Visit: Payer: Self-pay | Admitting: Cardiovascular Disease

## 2020-07-29 DIAGNOSIS — I10 Essential (primary) hypertension: Secondary | ICD-10-CM | POA: Diagnosis not present

## 2020-07-29 DIAGNOSIS — N1831 Chronic kidney disease, stage 3a: Secondary | ICD-10-CM | POA: Diagnosis not present

## 2020-07-29 DIAGNOSIS — Z0000 Encounter for general adult medical examination without abnormal findings: Secondary | ICD-10-CM | POA: Diagnosis not present

## 2020-07-29 DIAGNOSIS — E7800 Pure hypercholesterolemia, unspecified: Secondary | ICD-10-CM | POA: Diagnosis not present

## 2020-07-29 DIAGNOSIS — I2510 Atherosclerotic heart disease of native coronary artery without angina pectoris: Secondary | ICD-10-CM | POA: Diagnosis not present

## 2020-07-29 DIAGNOSIS — I5022 Chronic systolic (congestive) heart failure: Secondary | ICD-10-CM | POA: Diagnosis not present

## 2020-08-02 ENCOUNTER — Other Ambulatory Visit: Payer: Self-pay | Admitting: Cardiovascular Disease

## 2020-08-11 DIAGNOSIS — H401233 Low-tension glaucoma, bilateral, severe stage: Secondary | ICD-10-CM | POA: Diagnosis not present

## 2020-08-19 DIAGNOSIS — D649 Anemia, unspecified: Secondary | ICD-10-CM | POA: Diagnosis not present

## 2020-10-05 DIAGNOSIS — N1831 Chronic kidney disease, stage 3a: Secondary | ICD-10-CM | POA: Diagnosis not present

## 2020-10-05 DIAGNOSIS — I10 Essential (primary) hypertension: Secondary | ICD-10-CM | POA: Diagnosis not present

## 2020-10-05 DIAGNOSIS — E785 Hyperlipidemia, unspecified: Secondary | ICD-10-CM | POA: Diagnosis not present

## 2020-10-05 DIAGNOSIS — E7800 Pure hypercholesterolemia, unspecified: Secondary | ICD-10-CM | POA: Diagnosis not present

## 2020-10-21 DIAGNOSIS — M542 Cervicalgia: Secondary | ICD-10-CM | POA: Diagnosis not present

## 2020-10-22 ENCOUNTER — Other Ambulatory Visit: Payer: Self-pay | Admitting: Cardiovascular Disease

## 2020-11-24 ENCOUNTER — Other Ambulatory Visit: Payer: Self-pay | Admitting: Cardiovascular Disease

## 2020-12-04 DIAGNOSIS — R059 Cough, unspecified: Secondary | ICD-10-CM | POA: Diagnosis not present

## 2020-12-04 DIAGNOSIS — R252 Cramp and spasm: Secondary | ICD-10-CM | POA: Diagnosis not present

## 2020-12-18 DIAGNOSIS — G5621 Lesion of ulnar nerve, right upper limb: Secondary | ICD-10-CM | POA: Diagnosis not present

## 2020-12-21 ENCOUNTER — Other Ambulatory Visit: Payer: Self-pay | Admitting: Cardiovascular Disease

## 2020-12-22 NOTE — Telephone Encounter (Signed)
°*  STAT* If patient is at the pharmacy, call can be transferred to refill team.   1. Which medications need to be refilled? (please list name of each medication and dose if known) Carvedilol  2. Which pharmacy/location (including street and city if local pharmacy) is medication to be sent to? [place]  3. Do they need a 30 day or 90 day supply? Enough until his appointment on 03-31-21

## 2020-12-28 ENCOUNTER — Other Ambulatory Visit: Payer: Self-pay | Admitting: Cardiovascular Disease

## 2021-01-08 DIAGNOSIS — G5621 Lesion of ulnar nerve, right upper limb: Secondary | ICD-10-CM | POA: Diagnosis not present

## 2021-01-14 DIAGNOSIS — E785 Hyperlipidemia, unspecified: Secondary | ICD-10-CM | POA: Diagnosis not present

## 2021-01-14 DIAGNOSIS — I130 Hypertensive heart and chronic kidney disease with heart failure and stage 1 through stage 4 chronic kidney disease, or unspecified chronic kidney disease: Secondary | ICD-10-CM | POA: Diagnosis not present

## 2021-01-14 DIAGNOSIS — E7800 Pure hypercholesterolemia, unspecified: Secondary | ICD-10-CM | POA: Diagnosis not present

## 2021-01-14 DIAGNOSIS — I10 Essential (primary) hypertension: Secondary | ICD-10-CM | POA: Diagnosis not present

## 2021-01-23 ENCOUNTER — Other Ambulatory Visit: Payer: Self-pay | Admitting: Cardiovascular Disease

## 2021-01-29 DIAGNOSIS — N400 Enlarged prostate without lower urinary tract symptoms: Secondary | ICD-10-CM | POA: Diagnosis not present

## 2021-01-29 DIAGNOSIS — I2510 Atherosclerotic heart disease of native coronary artery without angina pectoris: Secondary | ICD-10-CM | POA: Diagnosis not present

## 2021-01-29 DIAGNOSIS — I5022 Chronic systolic (congestive) heart failure: Secondary | ICD-10-CM | POA: Diagnosis not present

## 2021-01-29 DIAGNOSIS — N1831 Chronic kidney disease, stage 3a: Secondary | ICD-10-CM | POA: Diagnosis not present

## 2021-01-29 DIAGNOSIS — I10 Essential (primary) hypertension: Secondary | ICD-10-CM | POA: Diagnosis not present

## 2021-01-29 DIAGNOSIS — E7800 Pure hypercholesterolemia, unspecified: Secondary | ICD-10-CM | POA: Diagnosis not present

## 2021-02-17 DIAGNOSIS — H25013 Cortical age-related cataract, bilateral: Secondary | ICD-10-CM | POA: Diagnosis not present

## 2021-02-17 DIAGNOSIS — H2513 Age-related nuclear cataract, bilateral: Secondary | ICD-10-CM | POA: Diagnosis not present

## 2021-02-17 DIAGNOSIS — H524 Presbyopia: Secondary | ICD-10-CM | POA: Diagnosis not present

## 2021-02-17 DIAGNOSIS — H401233 Low-tension glaucoma, bilateral, severe stage: Secondary | ICD-10-CM | POA: Diagnosis not present

## 2021-02-18 DIAGNOSIS — S66316A Strain of extensor muscle, fascia and tendon of right little finger at wrist and hand level, initial encounter: Secondary | ICD-10-CM | POA: Diagnosis not present

## 2021-02-25 DIAGNOSIS — G5621 Lesion of ulnar nerve, right upper limb: Secondary | ICD-10-CM | POA: Diagnosis not present

## 2021-03-05 DIAGNOSIS — M19031 Primary osteoarthritis, right wrist: Secondary | ICD-10-CM | POA: Insufficient documentation

## 2021-03-05 DIAGNOSIS — G5621 Lesion of ulnar nerve, right upper limb: Secondary | ICD-10-CM | POA: Diagnosis not present

## 2021-03-05 DIAGNOSIS — G5620 Lesion of ulnar nerve, unspecified upper limb: Secondary | ICD-10-CM | POA: Insufficient documentation

## 2021-03-05 DIAGNOSIS — M13831 Other specified arthritis, right wrist: Secondary | ICD-10-CM | POA: Diagnosis not present

## 2021-03-22 ENCOUNTER — Other Ambulatory Visit: Payer: Self-pay | Admitting: Cardiovascular Disease

## 2021-03-30 DIAGNOSIS — J019 Acute sinusitis, unspecified: Secondary | ICD-10-CM | POA: Diagnosis not present

## 2021-03-31 ENCOUNTER — Other Ambulatory Visit: Payer: Self-pay

## 2021-03-31 ENCOUNTER — Encounter: Payer: Self-pay | Admitting: Cardiovascular Disease

## 2021-03-31 ENCOUNTER — Ambulatory Visit: Payer: Medicare Other | Admitting: Cardiovascular Disease

## 2021-03-31 VITALS — BP 106/68 | HR 60 | Ht 70.0 in | Wt 156.4 lb

## 2021-03-31 DIAGNOSIS — E782 Mixed hyperlipidemia: Secondary | ICD-10-CM | POA: Diagnosis not present

## 2021-03-31 DIAGNOSIS — I2510 Atherosclerotic heart disease of native coronary artery without angina pectoris: Secondary | ICD-10-CM | POA: Diagnosis not present

## 2021-03-31 DIAGNOSIS — I10 Essential (primary) hypertension: Secondary | ICD-10-CM

## 2021-03-31 DIAGNOSIS — I5022 Chronic systolic (congestive) heart failure: Secondary | ICD-10-CM

## 2021-03-31 NOTE — Progress Notes (Signed)
?Cardiology Office Note:   ? ?Date:  03/31/2021  ? ?ID:  Jesus Warner, DOB Aug 22, 1935, MRN [id number] ? ?PCP:  Seward Carol, MD ?  ?[facility] Providers ?Cardiologist:  [name], MD    ? ?Referring MD: Seward Carol, MD  ? ?Chief Complaint  ?Patient presents with  ? Coronary Artery Disease  ? ? ?History of Present Illness:   ? ?Jesus Warner is a 86 y.o. male with a hx of coronary artery disease, systolic CHF 2/2 ischemic CM, hypertension, hyperlipidemia, LBBB.  He presented with an anterior MI in 09/2012 that was treated with a DES to the LAD.  His EF was initially 25-30 but improved to 45-50.  ? ?The patient is here alone today. He is doing well. Reports no interval problems with his health. Remains active but not engaged in routine exercise. Today, he denies symptoms of palpitations, chest pain, shortness of breath, orthopnea, PND, lower extremity edema, dizziness, or syncope. ? ? ?Past Medical History:  ?Diagnosis Date  ? Arthritis   ? BPH (benign prostatic hyperplasia)   ? CAD (coronary artery disease)   ? a. Anterior STEMI (9/14) => s/p DES-mid LAD (LHC 09/06/12: LAD occluded, after reperfusion distal LAD 40%, oCFX 80%, RCA with minimal irregularities, EF 40% with distal anterior, apical and infra-apical AK. PCI: Xience (3 x 18 mm) DES to the mid LAD. CFX was a small vessel and medical therapy was planned)  ? Chronic systolic CHF (congestive heart failure) ([place])   ? HTN (hypertension)   ? Hyperlipidemia   ? Ischemic cardiomyopathy   ? a.  Echocardiogram 09/06/12: EF 25-30%, mid to apical anteroseptal and inferoseptal AK, apical lateral AK, mid to apical anterior severe HK, true apex AK, grade 1 diastolic dysfunction, trivial MR, mildly reduced RVSF, PASP 38;   b. Echo (12/2012): Mild LVH, EF 45-50%, PASP 40  ? Pneumonia   ? Primary osteoarthritis of shoulder   ? Right  ? Seasonal allergies   ? ? ?Past Surgical History:  ?Procedure Laterality Date  ? CORONARY ANGIOPLASTY WITH STENT PLACEMENT   09/06/2012  ? total mid LAD occlusion s/p DES, 40% distal LAD, 80% ostial LCx (small vessel supplying single OM branch), widely patent RCA; akinesis of distal anterior, apical and inferoapical walls; EF 40%  ? LEFT HEART CATHETERIZATION WITH CORONARY ANGIOGRAM N/A 09/06/2012  ? Procedure: LEFT HEART CATHETERIZATION WITH CORONARY ANGIOGRAM;  Surgeon: [name], MD;  Location: [facility] CATH LAB;  Service: Cardiovascular;  Laterality: N/A;  ? PERCUTANEOUS CORONARY STENT INTERVENTION (PCI-S)  09/06/2012  ? Procedure: PERCUTANEOUS CORONARY STENT INTERVENTION (PCI-S);  Surgeon: [name], MD;  Location: [facility] CATH LAB;  Service: Cardiovascular;;  ? TOTAL SHOULDER ARTHROPLASTY Right 05/25/2017  ? Procedure: RIGHT TOTAL SHOULDER ARTHROPLASTY;  Surgeon: Tania Ade, MD;  Location: [place];  Service: Orthopedics;  Laterality: Right;  ? ? ?Current Medications: ?Current Meds  ?Medication Sig  ? aspirin 81 MG tablet Take 81 mg by mouth daily.  ? atorvastatin (LIPITOR) 80 MG tablet TAKE 1 TABLET(80 MG) BY MOUTH DAILY  ? benzonatate (TESSALON) 100 MG capsule Take 100 mg by mouth 3 (three) times daily as needed.  ? carvedilol (COREG) 6.25 MG tablet TAKE 1 TABLET BY MOUTH TWICE DAILY WITH MEALS. Please keep upcoming appt in March 2023 with Dr. Burt Knack before anymore refills. Thank you  ? cetirizine (ZYRTEC) 10 MG tablet Take 10 mg by mouth daily.  ? diclofenac sodium (VOLTAREN) 1 % GEL Apply 4 g topically 3 (three) times daily.   ?  doxycycline (VIBRA-TABS) 100 MG tablet Take 100 mg by mouth 2 (two) times daily.  ? finasteride (PROSCAR) 5 MG tablet Take 5 mg by mouth daily.   ? fluticasone (FLONASE) 50 MCG/ACT nasal spray Place 1 spray into both nostrils daily as needed for allergies.  ? latanoprost (XALATAN) 0.005 % ophthalmic solution Place 1 drop into both eyes at bedtime.  ? losartan (COZAAR) 50 MG tablet Take 1 tablet (50 mg total) by mouth daily. Please keep upcoming appt. In March with Dr. Burt Knack in order to receive future  refills. Thank You.  ? nitroGLYCERIN (NITROSTAT) 0.4 MG SL tablet Place 1 tablet (0.4 mg total) under the tongue every 5 (five) minutes x 3 doses as needed for chest pain.  ? oxyCODONE-acetaminophen (PERCOCET) 5-325 MG tablet Take 1-2 tablets by mouth every 4 (four) hours as needed for severe pain.  ? spironolactone (ALDACTONE) 25 MG tablet Take 1 tablet (25 mg total) by mouth daily. Please keep upcoming appt in March 2023 with Cardiologist before anymore refills. Thank you  ? tamsulosin (FLOMAX) 0.4 MG CAPS capsule Take 0.4 mg by mouth every evening.  ? triamcinolone acetonide (KENALOG-40) 40 MG/ML injection SMARTSIG:2 Milliliter(s) IM  ?  ? ?Allergies:   Patient has no known allergies.  ? ?Social History  ? ?Socioeconomic History  ? Marital status: Married  ?  Spouse name: Not on file  ? Number of children: Not on file  ? Years of education: Not on file  ? Highest education level: Not on file  ?Occupational History  ? Not on file  ?Tobacco Use  ? Smoking status: Never  ? Smokeless tobacco: Never  ?Vaping Use  ? Vaping Use: Never used  ?Substance and Sexual Activity  ? Alcohol use: No  ? Drug use: No  ? Sexual activity: Not on file  ?Other Topics Concern  ? Not on file  ?Social History Narrative  ? Lives at home with wife in [place]  ? ?Social Determinants of Health  ? ?Financial Resource Strain: Not on file  ?Food Insecurity: Not on file  ?Transportation Needs: Not on file  ?Physical Activity: Not on file  ?Stress: Not on file  ?Social Connections: Not on file  ?  ? ?Family History: ?The patient's family history includes Cancer in his maternal aunt, maternal aunt, and mother; Diabetes in his mother; Emphysema in his father. ? ?ROS:   ?Please see the history of present illness.    ?All other systems reviewed and are negative. ? ?EKGs/Labs/Other Studies Reviewed:   ? ?The following studies were reviewed today: ?Echo 07/15/2019: ?IMPRESSIONS  ? ? ? 1. There has been no significant change since the prior study on   ?04/06/2015.  ? 2. Left ventricular ejection fraction, by estimation, is 40 to 45%. The  ?left ventricle has mildly decreased function. The left ventricle  ?demonstrates global hypokinesis. Left ventricular diastolic parameters are  ?consistent with Grade I diastolic  ?dysfunction (impaired relaxation).  ? 3. Right ventricular systolic function is normal. The right ventricular  ?size is normal. There is mildly elevated pulmonary artery systolic  ?pressure. The estimated right ventricular systolic pressure is 05.3 mmHg.  ? 4. The mitral valve is normal in structure. No evidence of mitral valve  ?regurgitation. No evidence of mitral stenosis.  ? 5. Tricuspid valve regurgitation is moderate.  ? 6. The aortic valve is normal in structure. Aortic valve regurgitation is  ?not visualized. No aortic stenosis is present.  ? 7. The inferior vena cava is normal  in size with greater than 50%  ?respiratory variability, suggesting right atrial pressure of 3 mmHg.  ? ?Comparison(s): 04/06/15 EF 40-45%. PA pressure 31mHg.  ? ?EKG:  EKG is ordered today.  The ekg ordered today demonstrates NSR 60 bpm, LBBB, no change from prior ? ?Recent Labs: ?No results found for requested labs within last 8760 hours.  ?Recent Lipid Panel ?   ?Component Value Date/Time  ? CHOL 166 12/23/2013 0805  ? TRIG 103.0 12/23/2013 0805  ? HDL 38.10 (L) 12/23/2013 0805  ? CHOLHDL 4 12/23/2013 0805  ? VLDL 20.6 12/23/2013 0805  ? LBaxter107 (H) 12/23/2013 0805  ? ? ? ?Risk Assessment/Calculations:   ?  ? ?    ? ?Physical Exam:   ? ?VS:  BP 106/68   Pulse 60   Ht '5\' 10"'$  (1.778 m)   Wt 156 lb 6.4 oz (70.9 kg)   SpO2 98%   BMI 22.44 kg/m?    ? ?Wt Readings from Last 3 Encounters:  ?03/31/21 156 lb 6.4 oz (70.9 kg)  ?05/06/19 163 lb (73.9 kg)  ?04/24/18 168 lb (76.2 kg)  ?  ? ?GEN:  Well nourished, well developed in no acute distress ?HEENT: Normal ?NECK: No JVD; No carotid bruits ?LYMPHATICS: No lymphadenopathy ?CARDIAC: RRR, no murmurs, rubs,  gallops ?RESPIRATORY:  Clear to auscultation without rales, wheezing or rhonchi  ?ABDOMEN: Soft, non-tender, non-distended ?MUSCULOSKELETAL:  No edema; No deformity  ?SKIN: Warm and dry ?NEUROLOGIC:  Alert and oriented x 3 ?[place]

## 2021-03-31 NOTE — Patient Instructions (Signed)
Medication Instructions:  ?Your physician recommends that you continue on your current medications as directed. Please refer to the Current Medication list given to you today. ? ?*If you need a refill on your cardiac medications before your next appointment, please call your pharmacy* ? ? ?Lab Work: ?NONE ?If you have labs (blood work) drawn today and your tests are completely normal, you will receive your results only by: ?MyChart Message (if you have MyChart) OR ?A paper copy in the mail ?If you have any lab test that is abnormal or we need to change your treatment, we will call you to review the results. ? ? ?Testing/Procedures: ?NONE ? ? ?Follow-Up: ?At [facility], you and your health needs are our priority.  As part of our continuing mission to provide you with exceptional heart care, we have created designated Provider Care Teams.  These Care Teams include your primary Cardiologist (physician) and Advanced Practice Providers (APPs -  Physician Assistants and Nurse Practitioners) who all work together to provide you with the care you need, when you need it. ? ?We recommend signing up for the patient portal called "MyChart".  Sign up information is provided on this After Visit Summary.  MyChart is used to connect with patients for Virtual Visits (Telemedicine).  Patients are able to view lab/test results, encounter notes, upcoming appointments, etc.  Non-urgent messages can be sent to your provider as well.   ?To learn more about what you can do with MyChart, go to [email or web address].   ? ?Your next appointment:   ?1 year(s) ? ?The format for your next appointment:   ?In Person ? ?Provider:   ?Michael Cooper, MD   ? ?  ?

## 2021-04-19 ENCOUNTER — Other Ambulatory Visit: Payer: Self-pay | Admitting: Cardiovascular Disease

## 2021-04-22 DIAGNOSIS — N401 Enlarged prostate with lower urinary tract symptoms: Secondary | ICD-10-CM | POA: Diagnosis not present

## 2021-04-22 DIAGNOSIS — R351 Nocturia: Secondary | ICD-10-CM | POA: Diagnosis not present

## 2021-04-22 DIAGNOSIS — R3912 Poor urinary stream: Secondary | ICD-10-CM | POA: Diagnosis not present

## 2021-05-19 ENCOUNTER — Other Ambulatory Visit: Payer: Self-pay | Admitting: Cardiovascular Disease

## 2021-06-23 DIAGNOSIS — H401233 Low-tension glaucoma, bilateral, severe stage: Secondary | ICD-10-CM | POA: Diagnosis not present

## 2021-06-30 DIAGNOSIS — N401 Enlarged prostate with lower urinary tract symptoms: Secondary | ICD-10-CM | POA: Diagnosis not present

## 2021-06-30 DIAGNOSIS — R351 Nocturia: Secondary | ICD-10-CM | POA: Diagnosis not present

## 2021-08-06 DIAGNOSIS — N401 Enlarged prostate with lower urinary tract symptoms: Secondary | ICD-10-CM | POA: Diagnosis not present

## 2021-08-06 DIAGNOSIS — R3912 Poor urinary stream: Secondary | ICD-10-CM | POA: Diagnosis not present

## 2021-08-16 ENCOUNTER — Other Ambulatory Visit: Payer: Self-pay

## 2021-08-16 ENCOUNTER — Other Ambulatory Visit: Payer: Self-pay | Admitting: Cardiovascular Disease

## 2021-08-16 DIAGNOSIS — I5022 Chronic systolic (congestive) heart failure: Secondary | ICD-10-CM | POA: Insufficient documentation

## 2021-08-16 DIAGNOSIS — I130 Hypertensive heart and chronic kidney disease with heart failure and stage 1 through stage 4 chronic kidney disease, or unspecified chronic kidney disease: Secondary | ICD-10-CM | POA: Insufficient documentation

## 2021-08-16 DIAGNOSIS — N400 Enlarged prostate without lower urinary tract symptoms: Secondary | ICD-10-CM | POA: Insufficient documentation

## 2021-08-16 DIAGNOSIS — N1831 Chronic kidney disease, stage 3a: Secondary | ICD-10-CM | POA: Insufficient documentation

## 2021-08-16 DIAGNOSIS — K219 Gastro-esophageal reflux disease without esophagitis: Secondary | ICD-10-CM | POA: Insufficient documentation

## 2021-09-23 ENCOUNTER — Other Ambulatory Visit: Payer: Self-pay | Admitting: Cardiovascular Disease

## 2021-09-29 DIAGNOSIS — Z1331 Encounter for screening for depression: Secondary | ICD-10-CM | POA: Diagnosis not present

## 2021-09-29 DIAGNOSIS — N400 Enlarged prostate without lower urinary tract symptoms: Secondary | ICD-10-CM | POA: Diagnosis not present

## 2021-09-29 DIAGNOSIS — N1831 Chronic kidney disease, stage 3a: Secondary | ICD-10-CM | POA: Diagnosis not present

## 2021-09-29 DIAGNOSIS — I10 Essential (primary) hypertension: Secondary | ICD-10-CM | POA: Diagnosis not present

## 2021-09-29 DIAGNOSIS — I2510 Atherosclerotic heart disease of native coronary artery without angina pectoris: Secondary | ICD-10-CM | POA: Diagnosis not present

## 2021-09-29 DIAGNOSIS — Z0000 Encounter for general adult medical examination without abnormal findings: Secondary | ICD-10-CM | POA: Diagnosis not present

## 2021-09-29 DIAGNOSIS — D649 Anemia, unspecified: Secondary | ICD-10-CM | POA: Diagnosis not present

## 2021-09-29 DIAGNOSIS — I5022 Chronic systolic (congestive) heart failure: Secondary | ICD-10-CM | POA: Diagnosis not present

## 2021-09-29 DIAGNOSIS — J302 Other seasonal allergic rhinitis: Secondary | ICD-10-CM | POA: Diagnosis not present

## 2021-09-29 DIAGNOSIS — E7800 Pure hypercholesterolemia, unspecified: Secondary | ICD-10-CM | POA: Diagnosis not present

## 2021-10-15 DIAGNOSIS — D649 Anemia, unspecified: Secondary | ICD-10-CM | POA: Diagnosis not present

## 2021-10-26 DIAGNOSIS — H401233 Low-tension glaucoma, bilateral, severe stage: Secondary | ICD-10-CM | POA: Diagnosis not present

## 2021-11-15 DIAGNOSIS — D649 Anemia, unspecified: Secondary | ICD-10-CM | POA: Diagnosis not present

## 2021-12-10 DIAGNOSIS — N401 Enlarged prostate with lower urinary tract symptoms: Secondary | ICD-10-CM | POA: Diagnosis not present

## 2021-12-10 DIAGNOSIS — R3912 Poor urinary stream: Secondary | ICD-10-CM | POA: Diagnosis not present

## 2022-01-17 ENCOUNTER — Other Ambulatory Visit: Payer: Self-pay | Admitting: Urology

## 2022-01-19 ENCOUNTER — Other Ambulatory Visit: Payer: Self-pay | Admitting: Cardiovascular Disease

## 2022-01-21 ENCOUNTER — Other Ambulatory Visit: Payer: Self-pay | Admitting: Cardiovascular Disease

## 2022-02-02 NOTE — Patient Instructions (Addendum)
SURGICAL WAITING ROOM VISITATION Patients having surgery or a procedure may have no more than 2 support people in the waiting area - these visitors may rotate.    If the patient needs to stay at the hospital during part of their recovery, the visitor guidelines for inpatient rooms apply. Pre-op nurse will coordinate an appropriate time for 1 support person to accompany patient in pre-op.  This support person may not rotate.    Please refer to the [facility] website for the visitor guidelines for Inpatients (after your surgery is over and you are in a regular room).   Due to an increase in RSV and influenza rates and associated hospitalizations, children ages 66 and under may not visit patients in [place].     Your procedure is scheduled on: 02-11-22   Report to [facility] Main Entrance    Report to admitting at 5:15 AM   Call this number if you have problems the morning of surgery [phone]   Do not eat food or drink liquids :After Midnight.           If you have questions, please contact your surgeon's office.   FOLLOW ANY ADDITIONAL PRE OP INSTRUCTIONS YOU RECEIVED FROM YOUR SURGEON'S OFFICE!!!     Oral Hygiene is also important to reduce your risk of infection.                                    Remember - BRUSH YOUR TEETH THE MORNING OF SURGERY WITH YOUR REGULAR TOOTHPASTE   Do NOT smoke after Midnight   Take these medicines the morning of surgery with A SIP OF WATER:   Atorvastatin  Carvedilol  Zyrtec  Finasteride  Famotidine  Tamsulosin  Okay to use nasal spray and eyedrops                              You may not have any metal on your body including jewelry, and body piercing             Do not wear lotions, powders, cologne, or deodorant              Men may shave face and neck.   Do not bring valuables to the hospital. [place].   Contacts, dentures or bridgework may not be worn into  surgery.   Bring small overnight bag day of surgery.   DO NOT [name]. PHARMACY WILL DISPENSE MEDICATIONS LISTED ON YOUR MEDICATION LIST TO YOU DURING YOUR ADMISSION [name]!   Special Instructions: Bring a copy of your healthcare power of attorney and living will documents the day of surgery if you haven't scanned them before.              Please read over the following fact sheets you were given: IF Jesus Warner  If you received a COVID test during your pre-op visit  it is requested that you wear a mask when out in public, stay away from anyone that may not be feeling well and notify your surgeon if you develop symptoms. If you test positive for Covid or have been in contact with anyone that has tested positive in the last 10 days please notify you surgeon.  [place] - Preparing for Surgery Before surgery, you can play an important role.  Because skin is not sterile, your skin needs to be as free of germs as possible.  You can reduce the number of germs on your skin by washing with CHG (chlorahexidine gluconate) soap before surgery.  CHG is an antiseptic cleaner which kills germs and bonds with the skin to continue killing germs even after washing. Please DO NOT use if you have an allergy to CHG or antibacterial soaps.  If your skin becomes reddened/irritated stop using the CHG and inform your nurse when you arrive at Short Stay. Do not shave (including legs and underarms) for at least 48 hours prior to the first CHG shower.  You may shave your face/neck.  Please follow these instructions carefully:  1.  Shower with CHG Soap the night before surgery and the  morning of surgery.  2.  If you choose to wash your hair, wash your hair first as usual with your normal  shampoo.  3.  After you shampoo, rinse your hair and body thoroughly to remove the shampoo.                              4.  Use CHG as you would any other liquid soap.  You can apply chg directly to the skin and wash.  Gently with a scrungie or clean washcloth.  5.  Apply the CHG Soap to your body ONLY FROM THE NECK DOWN.   Do   not use on face/ open                           Wound or open sores. Avoid contact with eyes, ears mouth and   genitals (private parts).                       Wash face,  Genitals (private parts) with your normal soap.             6.  Wash thoroughly, paying special attention to the area where your    surgery  will be performed.  7.  Thoroughly rinse your body with warm water from the neck down.  8.  DO NOT shower/wash with your normal soap after using and rinsing off the CHG Soap.                9.  Pat yourself dry with a clean towel.            10.  Wear clean pajamas.            11.  Place clean sheets on your bed the night of your first shower and do not  sleep with pets. Day of Surgery : Do not apply any lotions/deodorants the morning of surgery.  Please wear clean clothes to the hospital/surgery center.  FAILURE TO FOLLOW THESE INSTRUCTIONS MAY RESULT IN THE CANCELLATION OF YOUR SURGERY  PATIENT SIGNATURE_________________________________  NURSE SIGNATURE__________________________________  ________________________________________________________________________

## 2022-02-02 NOTE — Progress Notes (Addendum)
COVID Vaccine Completed:  Yes  Date of COVID positive in last 90 days:  No  PCP - Seward Carol, MD Cardiologist - [name], MD  Chest x-ray - N/A EKG - 03-31-21 Epic Stress Test - N/A ECHO - 07-15-19 Epic Cardiac Cath - stent placement for MI 2014 Pacemaker/ICD device last checked: Spinal Cord Stimulator: N/A  Bowel Prep - N/A  Sleep Study - N/A CPAP -   Fasting Blood Sugar - N/A Checks Blood Sugar _____ times a day  Last dose of GLP1 agonist-  N/A GLP1 instructions:  N/A   Last dose of SGLT-2 inhibitors-  N/A SGLT-2 instructions: N/A   Blood Thinner Instructions: Aspirin Instructions:  ASA 81 Last Dose: to hold starting 02-06-22 per patient  Activity level:  Can go up a flight of stairs and perform activities of daily living without stopping and without symptoms of chest pain or shortness of breath.  Anesthesia review:   Hx of MI, cardiomyopathy, CAD,  LBBB, CHF, HTN, CKD  Patient denies shortness of breath, fever, cough and chest pain at PAT appointment  Patient verbalized understanding of instructions that were given to them at the PAT appointment. Patient was also instructed that they will need to review over the PAT instructions again at home before surgery.

## 2022-02-04 ENCOUNTER — Encounter (HOSPITAL_COMMUNITY)
Admission: RE | Admit: 2022-02-04 | Discharge: 2022-02-04 | Disposition: A | Discharge status: Home / Self Care | Payer: Medicare Other | Source: Ambulatory Visit | Attending: Urology | Admitting: Urology

## 2022-02-04 ENCOUNTER — Encounter (HOSPITAL_COMMUNITY): Payer: Self-pay

## 2022-02-04 ENCOUNTER — Other Ambulatory Visit: Payer: Self-pay

## 2022-02-04 VITALS — BP 114/69 | HR 65 | Temp 98.0°F | Resp 16 | Ht 70.0 in | Wt 151.2 lb

## 2022-02-04 DIAGNOSIS — Z01818 Encounter for other preprocedural examination: Secondary | ICD-10-CM

## 2022-02-04 DIAGNOSIS — N400 Enlarged prostate without lower urinary tract symptoms: Secondary | ICD-10-CM | POA: Insufficient documentation

## 2022-02-04 DIAGNOSIS — I255 Ischemic cardiomyopathy: Secondary | ICD-10-CM | POA: Insufficient documentation

## 2022-02-04 DIAGNOSIS — I2510 Atherosclerotic heart disease of native coronary artery without angina pectoris: Secondary | ICD-10-CM | POA: Insufficient documentation

## 2022-02-04 DIAGNOSIS — I5022 Chronic systolic (congestive) heart failure: Secondary | ICD-10-CM | POA: Diagnosis not present

## 2022-02-04 DIAGNOSIS — Z01812 Encounter for preprocedural laboratory examination: Secondary | ICD-10-CM | POA: Insufficient documentation

## 2022-02-04 DIAGNOSIS — I10 Essential (primary) hypertension: Secondary | ICD-10-CM

## 2022-02-04 DIAGNOSIS — I110 Hypertensive heart disease with heart failure: Secondary | ICD-10-CM | POA: Insufficient documentation

## 2022-02-04 HISTORY — DX: Gastro-esophageal reflux disease without esophagitis: K21.9

## 2022-02-04 HISTORY — DX: Acute myocardial infarction, unspecified: I21.9

## 2022-02-04 LAB — BASIC METABOLIC PANEL
Anion gap: 8 (ref 5–15)
BUN: 23 mg/dL (ref 8–23)
CO2: 23 mmol/L (ref 22–32)
Calcium: 8.8 mg/dL — ABNORMAL LOW (ref 8.9–10.3)
Chloride: 105 mmol/L (ref 98–111)
Creatinine, Ser: 1.17 mg/dL (ref 0.61–1.24)
GFR, Estimated: >60 mL/min (ref >60)
Glucose, Bld: 89 mg/dL (ref 70–99)
Potassium: 4.4 mmol/L (ref 3.5–5.1)
Sodium: 136 mmol/L (ref 135–145)

## 2022-02-04 LAB — CBC
HCT: 35.2 % — ABNORMAL LOW (ref 39.0–52.0)
Hemoglobin: 11.5 g/dL — ABNORMAL LOW (ref 13.0–17.0)
MCH: 31.9 pg (ref 26.0–34.0)
MCHC: 32.7 g/dL (ref 30.0–36.0)
MCV: 97.5 fL (ref 80.0–100.0)
Platelets: 186 10*3/uL (ref 150–400)
RBC: 3.61 MIL/uL — ABNORMAL LOW (ref 4.22–5.81)
RDW: 13.1 % (ref 11.5–15.5)
WBC: 6.6 10*3/uL (ref 4.0–10.5)
nRBC: 0 % (ref 0.0–0.2)

## 2022-02-07 ENCOUNTER — Telehealth: Payer: Self-pay | Admitting: Cardiovascular Disease

## 2022-02-07 NOTE — Telephone Encounter (Signed)
   Name: Jesus Warner  DOB: Mar 07, 1935  MRN: [id number]  Primary Cardiologist: [name], MD   Preoperative team, please contact this patient and set up a phone call appointment for further preoperative risk assessment. Please obtain consent and complete medication review. Thank you for your help.  I confirm that guidance regarding antiplatelet and oral anticoagulation therapy has been completed and, if necessary, noted below. No medications were asked to be held.   Darreld Mclean, PA-C 02/07/2022, 1:40 PM [facility]

## 2022-02-07 NOTE — Progress Notes (Signed)
Anesthesia Chart Review   Case: [id number] Date/Time: 02/11/22 0715   Procedure: TRANSURETHRAL WATERJET ABLATION OF PROSTATE - 120 MINS FOR CASE   Anesthesia type: General   Pre-op diagnosis: BENIGN PROSTATE HYPERPLASIA   Location: [facility]   Surgeons: Festus Aloe, MD       DISCUSSION:87 y.o. never smoker with h/o HTN, CAD (DES to LAD 2014), ischemic cardiomyopathy, LBBB, BPH scheduled for above procedure 02/11/2022 with Dr. Festus Aloe.   Per cardiology preoperative evaluation 02/09/2022, "According to the Revised Cardiac Risk Index (RCRI), his Perioperative Risk of Major Cardiac Event is (%): 6.6. His Functional Capacity in METs is: 7.34 according to the Duke Activity Status Index (DASI). Therefore, based on ACC/AHA guidelines, patient would be at acceptable risk for the planned procedure without further cardiovascular testing.    The patient was advised that if he develops new symptoms prior to surgery to contact our office to arrange for a follow-up visit, and he verbalized understanding.   Per office protocol, he may hold Aspirin for 5-7 days prior to procedure. Please resume Aspirin as soon as possible postprocedure, at the discretion of the surgeon."  Anticipate pt can proceed with planned procedure barring acute status change.   VS: BP 114/69   Pulse 65   Temp 36.7 C (Oral)   Resp 16   Ht '5\' 10"'$  (1.778 m)   Wt 68.6 kg   SpO2 100%   BMI 21.69 kg/m   PROVIDERS: Seward Carol, MD is PCP   Primary Cardiologist:  [name], MD  LABS: Labs reviewed: Acceptable for surgery. (all labs ordered are listed, but only abnormal results are displayed)  Labs Reviewed  BASIC METABOLIC PANEL - Abnormal; Notable for the following components:      Result Value   Calcium 8.8 (*)    All other components within normal limits  CBC - Abnormal; Notable for the following components:   RBC 3.61 (*)    Hemoglobin 11.5 (*)    HCT 35.2 (*)    All other  components within normal limits     IMAGES:   EKG:   CV: Echo 07/15/2019 1. There has been no significant change since the prior study on  04/06/2015.   2. Left ventricular ejection fraction, by estimation, is 40 to 45%. The  left ventricle has mildly decreased function. The left ventricle  demonstrates global hypokinesis. Left ventricular diastolic parameters are  consistent with Grade I diastolic  dysfunction (impaired relaxation).   3. Right ventricular systolic function is normal. The right ventricular  size is normal. There is mildly elevated pulmonary artery systolic  pressure. The estimated right ventricular systolic pressure is 14.7 mmHg.   4. The mitral valve is normal in structure. No evidence of mitral valve  regurgitation. No evidence of mitral stenosis.   5. Tricuspid valve regurgitation is moderate.   6. The aortic valve is normal in structure. Aortic valve regurgitation is  not visualized. No aortic stenosis is present.   7. The inferior vena cava is normal in size with greater than 50%  respiratory variability, suggesting right atrial pressure of 3 mmHg.  Past Medical History:  Diagnosis Date   Arthritis    BPH (benign prostatic hyperplasia)    CAD (coronary artery disease)    a. Anterior STEMI (9/14) => s/p DES-mid LAD (LHC 09/06/12: LAD occluded, after reperfusion distal LAD 40%, oCFX 80%, RCA with minimal irregularities, EF 40% with distal anterior, apical and infra-apical AK. PCI: Xience (3 x  18 mm) DES to the mid LAD. CFX was a small vessel and medical therapy was planned)   Chronic systolic CHF (congestive heart failure) (HCC)    GERD (gastroesophageal reflux disease)    HTN (hypertension)    Hyperlipidemia    Ischemic cardiomyopathy    a.  Echocardiogram 09/06/12: EF 25-30%, mid to apical anteroseptal and inferoseptal AK, apical lateral AK, mid to apical anterior severe HK, true apex AK, grade 1 diastolic dysfunction, trivial MR, mildly reduced RVSF, PASP 38;    b. Echo (12/2012): Mild LVH, EF 45-50%, PASP 40   Myocardial infarction (H[facility])    Pneumonia    Primary osteoarthritis of shoulder    Right   Seasonal allergies     Past Surgical History:  Procedure Laterality Date   CORONARY ANGIOPLASTY WITH STENT PLACEMENT  09/06/2012   total mid LAD occlusion s/p DES, 40% distal LAD, 80% ostial LCx (small vessel supplying single OM branch), widely patent RCA; akinesis of distal anterior, apical and inferoapical walls; EF 40%   FOOT SURGERY     LEFT HEART CATHETERIZATION WITH CORONARY ANGIOGRAM N/A 09/06/2012   Procedure: LEFT HEART CATHETERIZATION WITH CORONARY ANGIOGRAM;  Surgeon: [name], MD;  Location: [facility] CATH LAB;  Service: Cardiovascular;  Laterality: N/A;   PERCUTANEOUS CORONARY STENT INTERVENTION (PCI-S)  09/06/2012   Procedure: PERCUTANEOUS CORONARY STENT INTERVENTION (PCI-S);  Surgeon: [name], MD;  Location: [facility] CATH LAB;  Service: Cardiovascular;;   TOTAL SHOULDER ARTHROPLASTY Right 05/25/2017   Procedure: RIGHT TOTAL SHOULDER ARTHROPLASTY;  Surgeon: Tania Ade, MD;  Location: [place];  Service: Orthopedics;  Laterality: Right;    MEDICATIONS:  Ascorbic Acid (VITAMIN C) 1000 MG tablet   aspirin 81 MG tablet   atorvastatin (LIPITOR) 80 MG tablet   carvedilol (COREG) 6.25 MG tablet   famotidine (PEPCID) 20 MG tablet   finasteride (PROSCAR) 5 MG tablet   fluticasone (FLONASE) 50 MCG/ACT nasal spray   latanoprost (XALATAN) 0.005 % ophthalmic solution   losartan (COZAAR) 25 MG tablet   nitroGLYCERIN (NITROSTAT) 0.4 MG SL tablet   spironolactone (ALDACTONE) 25 MG tablet   tamsulosin (FLOMAX) 0.4 MG CAPS capsule   No current facility-administered medications for this encounter.   Konrad Felix Ward, PA-C [facility] Pre-Surgical Testing [phone]

## 2022-02-07 NOTE — Telephone Encounter (Signed)
   Pre-operative Risk Assessment    Patient Name: Jesus Warner  DOB: Aug 12, 1935 MRN: [id number]      Request for Surgical Clearance    Procedure:   Aquablation of the prostate  Date of Surgery:  Clearance 02/11/22                                 Surgeon:  Festus Aloe, MD Surgeon's Group or Practice Name:  [facility] Phone number:  [phone] Fax number:  [phone]   Type of Clearance Requested:   - Medical    Type of Anesthesia:  General    Additional requests/questions:    SignedTrilby Drummer   02/07/2022, 1:32 PM

## 2022-02-07 NOTE — Telephone Encounter (Signed)
I s/w Pam, surgery scheduler and confirmed they are going to need to hold ASA 3-5 days prior. I did inform Pam that the pt is going to need a tele visit. Once we have the pt cleared we will fax notes. Pam thanked me for the help today. Our office just received the request today for procedure Friday 02/11/22.    Per the pre op APP we need to add pt to 3:20 pre op today due med hold and procedure date. I tried to call the pt but no answer. I then tried cell but vm not set up. I will update the requesting office as well. I did leave a message for the pt that he needs tele appt today @ 3:20.

## 2022-02-08 ENCOUNTER — Telehealth: Payer: Self-pay

## 2022-02-08 NOTE — Telephone Encounter (Signed)
Patient agreeable with telehealth visit tomorrow , 02/09/22 at 10:40am, ok per Diona Browner, NP due to the patients wife having therapy in the evenings. Consent given and med list reviewed.

## 2022-02-08 NOTE — Telephone Encounter (Signed)
  Patient Consent for Virtual Visit         Jesus Warner has provided verbal consent on 02/08/2022 for a virtual visit (video or telephone).   CONSENT FOR VIRTUAL VISIT FOR:  Jesus Warner  By participating in this virtual visit I agree to the following:  I hereby voluntarily request, consent and authorize [facility] and its employed or contracted physicians, physician assistants, nurse practitioners or other licensed health care professionals (the Practitioner), to provide me with telemedicine health care services (the "Services") as deemed necessary by the treating Practitioner. I acknowledge and consent to receive the Services by the Practitioner via telemedicine. I understand that the telemedicine visit will involve communicating with the Practitioner through live audiovisual communication technology and the disclosure of certain medical information by electronic transmission. I acknowledge that I have been given the opportunity to request an in-person assessment or other available alternative prior to the telemedicine visit and am voluntarily participating in the telemedicine visit.  I understand that I have the right to withhold or withdraw my consent to the use of telemedicine in the course of my care at any time, without affecting my right to future care or treatment, and that the Practitioner or I may terminate the telemedicine visit at any time. I understand that I have the right to inspect all information obtained and/or recorded in the course of the telemedicine visit and may receive copies of available information for a reasonable fee.  I understand that some of the potential risks of receiving the Services via telemedicine include:  Delay or interruption in medical evaluation due to technological equipment failure or disruption; Information transmitted may not be sufficient (e.g. poor resolution of images) to allow for appropriate medical decision making by the  Practitioner; and/or  In rare instances, security protocols could fail, causing a breach of personal health information.  Furthermore, I acknowledge that it is my responsibility to provide information about my medical history, conditions and care that is complete and accurate to the best of my ability. I acknowledge that Practitioner's advice, recommendations, and/or decision may be based on factors not within their control, such as incomplete or inaccurate data provided by me or distortions of diagnostic images or specimens that may result from electronic transmissions. I understand that the practice of medicine is not an exact science and that Practitioner makes no warranties or guarantees regarding treatment outcomes. I acknowledge that a copy of this consent can be made available to me via my patient portal ([facility]), or I can request a printed copy by calling the office of [facility].    I understand that my insurance will be billed for this visit.   I have read or had this consent read to me. I understand the contents of this consent, which adequately explains the benefits and risks of the Services being provided via telemedicine.  I have been provided ample opportunity to ask questions regarding this consent and the Services and have had my questions answered to my satisfaction. I give my informed consent for the services to be provided through the use of telemedicine in my medical care

## 2022-02-08 NOTE — Telephone Encounter (Signed)
Patient returned call

## 2022-02-09 ENCOUNTER — Ambulatory Visit: Payer: Medicare Other | Attending: Cardiovascular Disease | Admitting: Nurse Practitioner

## 2022-02-09 DIAGNOSIS — Z01810 Encounter for preprocedural cardiovascular examination: Secondary | ICD-10-CM

## 2022-02-09 NOTE — Progress Notes (Signed)
Virtual Visit via Telephone Note   Because of BETTY BROOKS co-morbid illnesses, he is at least at moderate risk for complications without adequate follow up.  This format is felt to be most appropriate for this patient at this time.  The patient did not have access to video technology/had technical difficulties with video requiring transitioning to audio format only (telephone).  All issues noted in this document were discussed and addressed.  No physical exam could be performed with this format.  Please refer to the patient's chart for his consent to telehealth for [facility].  Evaluation Performed:  Preoperative cardiovascular risk assessment _____________   Date:  02/09/2022   Patient ID:  Jesus Warner, DOB 05/06/1935, MRN [id number] Patient Location:  Home Provider location:   Office  Primary Care Provider:  Seward Carol, MD Primary Cardiologist:  [name], MD  Chief Complaint / Patient Profile   87 y.o. y/o male with a h/o CAD s/p MI, DES-LAD in 8921, chronic systolic heart failure, ICM, hypertension, hyperlipidemia, and LBBB who is pending aquablation of the prostate on 02/11/2022 with Dr. Festus Aloe of [facility] and presents today for telephonic preoperative cardiovascular risk assessment.  History of Present Illness    Jesus Warner is a 87 y.o. male who presents via audio/video conferencing for a telehealth visit today.  Pt was last seen in cardiology clinic on 03/31/2021 by Dr. Burt Knack.  At that time Jesus Warner was doing well.  The patient is now pending procedure as outlined above. Since his last visit, he has done well form a cardiac standpoint.   He denies chest pain, palpitations, dyspnea, pnd, orthopnea, n, v, dizziness, syncope, edema, weight gain, or early satiety. All other systems reviewed and are otherwise negative except as noted above.   Past Medical History    Past Medical History:  Diagnosis Date   Arthritis     BPH (benign prostatic hyperplasia)    CAD (coronary artery disease)    a. Anterior STEMI (9/14) => s/p DES-mid LAD (LHC 09/06/12: LAD occluded, after reperfusion distal LAD 40%, oCFX 80%, RCA with minimal irregularities, EF 40% with distal anterior, apical and infra-apical AK. PCI: Xience (3 x 18 mm) DES to the mid LAD. CFX was a small vessel and medical therapy was planned)   Chronic systolic CHF (congestive heart failure) (HCC)    GERD (gastroesophageal reflux disease)    HTN (hypertension)    Hyperlipidemia    Ischemic cardiomyopathy    a.  Echocardiogram 09/06/12: EF 25-30%, mid to apical anteroseptal and inferoseptal AK, apical lateral AK, mid to apical anterior severe HK, true apex AK, grade 1 diastolic dysfunction, trivial MR, mildly reduced RVSF, PASP 38;   b. Echo (12/2012): Mild LVH, EF 45-50%, PASP 40   Myocardial infarction (HCC)    Pneumonia    Primary osteoarthritis of shoulder    Right   Seasonal allergies    Past Surgical History:  Procedure Laterality Date   CORONARY ANGIOPLASTY WITH STENT PLACEMENT  09/06/2012   total mid LAD occlusion s/p DES, 40% distal LAD, 80% ostial LCx (small vessel supplying single OM branch), widely patent RCA; akinesis of distal anterior, apical and inferoapical walls; EF 40%   FOOT SURGERY     LEFT HEART CATHETERIZATION WITH CORONARY ANGIOGRAM N/A 09/06/2012   Procedure: LEFT HEART CATHETERIZATION WITH CORONARY ANGIOGRAM;  Surgeon: [name], MD;  Location: [facility] CATH LAB;  Service: Cardiovascular;  Laterality: N/A;   PERCUTANEOUS CORONARY STENT INTERVENTION (PCI-S)  09/06/2012   Procedure: PERCUTANEOUS CORONARY STENT INTERVENTION (PCI-S);  Surgeon: [name], MD;  Location: [facility] C[facility] LAB;  Service: Cardiovascular;;   TOTAL SHOULDER ARTHROPLASTY Right 05/25/2017   Procedure: RIGHT TOTAL SHOULDER ARTHROPLASTY;  Surgeon: Tania Ade, MD;  Location: [facility];  Service: Orthopedics;  Laterality: Right;    Allergies  No Known Allergies  Home  Medications    Prior to Admission medications   Medication Sig Start Date End Date Taking? Authorizing Provider  Ascorbic Acid (VITAMIN C) 1000 MG tablet Take 1,000 mg by mouth daily. Patient not taking: Reported on 02/08/2022    [provider]  aspirin 81 MG tablet Take 81 mg by mouth daily. Patient not taking: Reported on 02/08/2022    [provider]  atorvastatin (LIPITOR) 80 MG tablet TAKE 1 TABLET(80 MG) BY MOUTH DAILY 08/16/21   [name], MD  carvedilol (COREG) 6.25 MG tablet TAKE 1 TABLET BY MOUTH TWICE DAILY WITH MEALS 04/20/21   [name], MD  famotidine (PEPCID) 20 MG tablet Take 20 mg by mouth in the morning.    [provider]  finasteride (PROSCAR) 5 MG tablet Take 5 mg by mouth in the morning. 02/28/15   [provider]  fluticasone (FLONASE) 50 MCG/ACT nasal spray Place 1 spray into both nostrils in the morning.    [provider]  latanoprost (XALATAN) 0.005 % ophthalmic solution Place 1 drop into both eyes at bedtime.    [provider]  losartan (COZAAR) 25 MG tablet Take 25 mg by mouth daily. 07/15/21   [provider]  nitroGLYCERIN (NITROSTAT) 0.4 MG SL tablet Place 1 tablet (0.4 mg total) under the tongue every 5 (five) minutes x 3 doses as needed for chest pain. 09/02/19   [name], MD  spironolactone (ALDACTONE) 25 MG tablet TAKE 1 TABLET(25 MG) BY MOUTH DAILY 01/24/22   [name], MD  tamsulosin [facility]) 0.4 MG CAPS capsule Take 0.4 mg by mouth every evening. 03/03/14   [provider]    Physical Exam    Vital Signs:  Jesus Warner does not have vital signs available for review today.  Given telephonic nature of communication, physical exam is limited. AAOx3. NAD. Normal affect.  Speech and respirations are unlabored.  Accessory Clinical Findings    None  Assessment & Plan    1.  Preoperative Cardiovascular Risk Assessment:  According to the Revised Cardiac Risk  Index (RCRI), his Perioperative Risk of Major Cardiac Event is (%): 6.6. His Functional Capacity in METs is: 7.34 according to the Duke Activity Status Index (DASI). Therefore, based on ACC/AHA guidelines, patient would be at acceptable risk for the planned procedure without further cardiovascular testing.   The patient was advised that if he develops new symptoms prior to surgery to contact our office to arrange for a follow-up visit, and he verbalized understanding.  Per office protocol, he may hold Aspirin for 5-7 days prior to procedure. Please resume Aspirin as soon as possible postprocedure, at the discretion of the surgeon.   A copy of this note will be routed to requesting surgeon.  Time:   Today, I have spent 6 minutes with the patient with telehealth technology discussing medical history, symptoms, and management plan.     Lenna Sciara, NP  02/09/2022, 10:47 AM

## 2022-02-09 NOTE — Anesthesia Preprocedure Evaluation (Addendum)
Anesthesia Evaluation  Patient identified by MRN, date of birth, ID band Patient awake    Reviewed: Allergy & Precautions, NPO status , Patient's Chart, lab work & pertinent test results, reviewed documented beta blocker date and time   History of Anesthesia Complications Negative for: history of anesthetic complications  Airway Mallampati: III  TM Distance: >3 FB Neck ROM: Full   Comment: Previous grade I view with Miller 2, easy mask Dental  (+) Upper Dentures, Partial Lower   Pulmonary neg pulmonary ROS   Pulmonary exam normal breath sounds clear to auscultation       Cardiovascular hypertension, Pt. on home beta blockers (-) angina + CAD, + Past MI (STEMI 2014), + Cardiac Stents and +CHF (EF 40-45%)  (-) CABG + dysrhythmias (LBBB) + Valvular Problems/Murmurs (moderate TR)  Rhythm:Regular Rate:Normal  HLD  TTE 07/15/2019: IMPRESSIONS     1. There has been no significant change since the prior study on  04/06/2015.   2. Left ventricular ejection fraction, by estimation, is 40 to 45%. The  left ventricle has mildly decreased function. The left ventricle  demonstrates global hypokinesis. Left ventricular diastolic parameters are  consistent with Grade I diastolic  dysfunction (impaired relaxation).   3. Right ventricular systolic function is normal. The right ventricular  size is normal. There is mildly elevated pulmonary artery systolic  pressure. The estimated right ventricular systolic pressure is 16.9 mmHg.   4. The mitral valve is normal in structure. No evidence of mitral valve  regurgitation. No evidence of mitral stenosis.   5. Tricuspid valve regurgitation is moderate.   6. The aortic valve is normal in structure. Aortic valve regurgitation is  not visualized. No aortic stenosis is present.   7. The inferior vena cava is normal in size with greater than 50%  respiratory variability, suggesting right atrial pressure  of 3 mmHg.     Neuro/Psych neg Seizures  Neuromuscular disease (ulnar nerve entrapment)    GI/Hepatic Neg liver ROS,GERD  ,,  Endo/Other  negative endocrine ROS    Renal/GU CRFRenal disease   BPH    Musculoskeletal  (+) Arthritis , Osteoarthritis,    Abdominal   Peds  Hematology negative hematology ROS (+)   Anesthesia Other Findings 87 y.o. y/o male with a h/o CAD s/p MI, DES-LAD in 6789, chronic systolic heart failure, ICM, hypertension, hyperlipidemia, and LBBB  Reproductive/Obstetrics                             Anesthesia Physical Anesthesia Plan  ASA: 3  Anesthesia Plan: General   Post-op Pain Management: Tylenol PO (pre-op)*   Induction: Intravenous  PONV Risk Score and Plan: 2 and Ondansetron, Dexamethasone and Treatment may vary due to age or medical condition  Airway Management Planned: Oral ETT  Additional Equipment:   Intra-op Plan:   Post-operative Plan: Extubation in OR  Informed Consent: I have reviewed the patients History and Physical, chart, labs and discussed the procedure including the risks, benefits and alternatives for the proposed anesthesia with the patient or authorized representative who has indicated his/her understanding and acceptance.     Dental advisory given  Plan Discussed with: CRNA and Anesthesiologist  Anesthesia Plan Comments: (Risks of general anesthesia discussed including, but not limited to, sore throat, hoarse voice, chipped/damaged teeth, injury to vocal cords, nausea and vomiting, allergic reactions, lung infection, heart attack, stroke, and death. All questions answered.   See PAT note 02/04/22)  Anesthesia Quick Evaluation  

## 2022-02-11 ENCOUNTER — Encounter (HOSPITAL_COMMUNITY): Payer: Self-pay | Admitting: Urology

## 2022-02-11 ENCOUNTER — Ambulatory Visit (HOSPITAL_COMMUNITY): Payer: Medicare Other

## 2022-02-11 ENCOUNTER — Encounter (HOSPITAL_COMMUNITY)
Admission: RE | Disposition: A | Discharge status: Home / Self Care | Payer: Self-pay | Source: Home / Self Care | Attending: Urology

## 2022-02-11 ENCOUNTER — Ambulatory Visit (HOSPITAL_BASED_OUTPATIENT_CLINIC_OR_DEPARTMENT_OTHER): Payer: Medicare Other | Admitting: Anesthesiology

## 2022-02-11 ENCOUNTER — Ambulatory Visit (HOSPITAL_COMMUNITY): Payer: Medicare Other | Admitting: Physician Assistant

## 2022-02-11 ENCOUNTER — Other Ambulatory Visit: Payer: Self-pay

## 2022-02-11 ENCOUNTER — Observation Stay (HOSPITAL_COMMUNITY)
Admission: RE | Admit: 2022-02-11 | Discharge: 2022-02-12 | Disposition: A | Discharge status: Home / Self Care | Payer: Medicare Other | Attending: Urology | Admitting: Urology

## 2022-02-11 DIAGNOSIS — R3912 Poor urinary stream: Secondary | ICD-10-CM

## 2022-02-11 DIAGNOSIS — I509 Heart failure, unspecified: Secondary | ICD-10-CM

## 2022-02-11 DIAGNOSIS — N401 Enlarged prostate with lower urinary tract symptoms: Secondary | ICD-10-CM

## 2022-02-11 DIAGNOSIS — N138 Other obstructive and reflux uropathy: Secondary | ICD-10-CM | POA: Diagnosis present

## 2022-02-11 DIAGNOSIS — Z79899 Other long term (current) drug therapy: Secondary | ICD-10-CM | POA: Insufficient documentation

## 2022-02-11 DIAGNOSIS — R350 Frequency of micturition: Secondary | ICD-10-CM

## 2022-02-11 DIAGNOSIS — I5022 Chronic systolic (congestive) heart failure: Secondary | ICD-10-CM | POA: Insufficient documentation

## 2022-02-11 DIAGNOSIS — Z7982 Long term (current) use of aspirin: Secondary | ICD-10-CM | POA: Diagnosis not present

## 2022-02-11 DIAGNOSIS — I110 Hypertensive heart disease with heart failure: Secondary | ICD-10-CM | POA: Insufficient documentation

## 2022-02-11 DIAGNOSIS — N400 Enlarged prostate without lower urinary tract symptoms: Secondary | ICD-10-CM | POA: Diagnosis not present

## 2022-02-11 DIAGNOSIS — N320 Bladder-neck obstruction: Secondary | ICD-10-CM | POA: Diagnosis not present

## 2022-02-11 DIAGNOSIS — N189 Chronic kidney disease, unspecified: Secondary | ICD-10-CM

## 2022-02-11 DIAGNOSIS — I130 Hypertensive heart and chronic kidney disease with heart failure and stage 1 through stage 4 chronic kidney disease, or unspecified chronic kidney disease: Secondary | ICD-10-CM

## 2022-02-11 DIAGNOSIS — Z96611 Presence of right artificial shoulder joint: Secondary | ICD-10-CM | POA: Insufficient documentation

## 2022-02-11 DIAGNOSIS — I2510 Atherosclerotic heart disease of native coronary artery without angina pectoris: Secondary | ICD-10-CM | POA: Insufficient documentation

## 2022-02-11 SURGERY — ABLATION, PROSTATE, TRANSURETHRAL, USING WATERJET
Anesthesia: General

## 2022-02-11 MED ORDER — PHENYLEPHRINE 80 MCG/ML (10ML) SYRINGE FOR IV PUSH (FOR BLOOD PRESSURE SUPPORT)
PREFILLED_SYRINGE | INTRAVENOUS | Status: DC | PRN
  Administered 2022-02-11 (×4): 160 ug via INTRAVENOUS

## 2022-02-11 MED ORDER — FENTANYL CITRATE (PF) 100 MCG/2ML IJ SOLN
INTRAMUSCULAR | Status: AC
  Filled 2022-02-11: qty 2

## 2022-02-11 MED ORDER — DIATRIZOATE MEGLUMINE 30 % UR SOLN
URETHRAL | Status: DC | PRN
  Administered 2022-02-11: 100 mL via URETHRAL

## 2022-02-11 MED ORDER — NITROGLYCERIN 0.4 MG SL SUBL: 0.4000 mg | SUBLINGUAL_TABLET | SUBLINGUAL | Status: DC | PRN

## 2022-02-11 MED ORDER — EPHEDRINE SULFATE-NACL 50-0.9 MG/10ML-% IV SOSY
PREFILLED_SYRINGE | INTRAVENOUS | Status: DC | PRN
  Administered 2022-02-11 (×2): 10 mg via INTRAVENOUS

## 2022-02-11 MED ORDER — ATORVASTATIN CALCIUM 40 MG PO TABS
40.0000 mg | ORAL_TABLET | Freq: Every day | ORAL | Status: DC
  Administered 2022-02-11 – 2022-02-12 (×2): 40 mg via ORAL
  Filled 2022-02-11 (×2): qty 1

## 2022-02-11 MED ORDER — TAMSULOSIN HCL 0.4 MG PO CAPS
0.4000 mg | ORAL_CAPSULE | Freq: Every evening | ORAL | Status: DC
  Administered 2022-02-11: 0.4 mg via ORAL
  Filled 2022-02-11: qty 1

## 2022-02-11 MED ORDER — OXYCODONE HCL 5 MG PO TABS
5.0000 mg | ORAL_TABLET | Freq: Once | ORAL | Status: AC | PRN
  Administered 2022-02-11: 5 mg via ORAL

## 2022-02-11 MED ORDER — PROPOFOL 10 MG/ML IV BOLUS
INTRAVENOUS | Status: AC
  Filled 2022-02-11: qty 20

## 2022-02-11 MED ORDER — CARVEDILOL 6.25 MG PO TABS
6.2500 mg | ORAL_TABLET | Freq: Two times a day (BID) | ORAL | Status: DC
  Administered 2022-02-12: 6.25 mg via ORAL
  Filled 2022-02-11: qty 1

## 2022-02-11 MED ORDER — SUGAMMADEX SODIUM 200 MG/2ML IV SOLN
INTRAVENOUS | Status: DC | PRN
  Administered 2022-02-11: 200 mg via INTRAVENOUS

## 2022-02-11 MED ORDER — SODIUM CHLORIDE 0.9 % IR SOLN: 3000.0000 mL | Status: DC

## 2022-02-11 MED ORDER — PROPOFOL 10 MG/ML IV BOLUS
INTRAVENOUS | Status: DC | PRN
  Administered 2022-02-11: 90 mg via INTRAVENOUS

## 2022-02-11 MED ORDER — MORPHINE SULFATE (PF) 2 MG/ML IV SOLN: 1.0000 mg | INTRAVENOUS | Status: DC | PRN

## 2022-02-11 MED ORDER — ACETAMINOPHEN 325 MG PO TABS: 650.0000 mg | ORAL_TABLET | ORAL | Status: DC | PRN

## 2022-02-11 MED ORDER — FINASTERIDE 5 MG PO TABS
5.0000 mg | ORAL_TABLET | Freq: Every morning | ORAL | Status: DC
  Administered 2022-02-11 – 2022-02-12 (×2): 5 mg via ORAL
  Filled 2022-02-11 (×2): qty 1

## 2022-02-11 MED ORDER — ACETAMINOPHEN 500 MG PO TABS
ORAL_TABLET | ORAL | Status: AC
  Filled 2022-02-11: qty 2

## 2022-02-11 MED ORDER — DOCUSATE SODIUM 100 MG PO CAPS
100.0000 mg | ORAL_CAPSULE | Freq: Two times a day (BID) | ORAL | Status: DC
  Administered 2022-02-11 – 2022-02-12 (×2): 100 mg via ORAL
  Filled 2022-02-11 (×2): qty 1

## 2022-02-11 MED ORDER — HYDROCODONE-ACETAMINOPHEN 5-325 MG PO TABS
1.0000 | ORAL_TABLET | ORAL | Status: DC | PRN
  Administered 2022-02-11: 1 via ORAL
  Filled 2022-02-11: qty 1

## 2022-02-11 MED ORDER — CEPHALEXIN 500 MG PO CAPS: 500.0000 mg | ORAL_CAPSULE | Freq: Every day | ORAL | 0 refills | Status: DC

## 2022-02-11 MED ORDER — FLUTICASONE PROPIONATE 50 MCG/ACT NA SUSP
1.0000 | Freq: Every morning | NASAL | Status: DC
  Administered 2022-02-11 – 2022-02-12 (×2): 1 via NASAL
  Filled 2022-02-11: qty 16

## 2022-02-11 MED ORDER — FAMOTIDINE 20 MG PO TABS
20.0000 mg | ORAL_TABLET | Freq: Every morning | ORAL | Status: DC
  Administered 2022-02-11 – 2022-02-12 (×2): 20 mg via ORAL
  Filled 2022-02-11 (×2): qty 1

## 2022-02-11 MED ORDER — ZOLPIDEM TARTRATE 5 MG PO TABS: 5.0000 mg | ORAL_TABLET | Freq: Every evening | ORAL | Status: DC | PRN

## 2022-02-11 MED ORDER — FENTANYL CITRATE PF 50 MCG/ML IJ SOSY: 25.0000 ug | PREFILLED_SYRINGE | INTRAMUSCULAR | Status: DC | PRN

## 2022-02-11 MED ORDER — DEXAMETHASONE SODIUM PHOSPHATE 10 MG/ML IJ SOLN
INTRAMUSCULAR | Status: DC | PRN
  Administered 2022-02-11: 10 mg via INTRAVENOUS

## 2022-02-11 MED ORDER — 0.9 % SODIUM CHLORIDE (POUR BTL) OPTIME
TOPICAL | Status: DC | PRN
  Administered 2022-02-11: 1000 mL

## 2022-02-11 MED ORDER — CHLORHEXIDINE GLUCONATE 0.12 % MT SOLN
15.0000 mL | Freq: Once | OROMUCOSAL | Status: AC
  Administered 2022-02-11: 15 mL via OROMUCOSAL

## 2022-02-11 MED ORDER — SODIUM CHLORIDE 0.9 % IR SOLN
Status: DC | PRN
  Administered 2022-02-11: 15000 mL

## 2022-02-11 MED ORDER — ACETAMINOPHEN 500 MG PO TABS
1000.0000 mg | ORAL_TABLET | Freq: Once | ORAL | Status: AC
  Administered 2022-02-11: 1000 mg via ORAL

## 2022-02-11 MED ORDER — AMISULPRIDE (ANTIEMETIC) 5 MG/2ML IV SOLN: 10.0000 mg | Freq: Once | INTRAVENOUS | Status: DC | PRN

## 2022-02-11 MED ORDER — DIATRIZOATE MEGLUMINE 30 % UR SOLN
URETHRAL | Status: AC
  Filled 2022-02-11: qty 100

## 2022-02-11 MED ORDER — ROCURONIUM BROMIDE 10 MG/ML (PF) SYRINGE
PREFILLED_SYRINGE | INTRAVENOUS | Status: DC | PRN
  Administered 2022-02-11: 60 mg via INTRAVENOUS

## 2022-02-11 MED ORDER — LATANOPROST 0.005 % OP SOLN
1.0000 [drp] | Freq: Every day | OPHTHALMIC | Status: DC
  Administered 2022-02-11: 1 [drp] via OPHTHALMIC
  Filled 2022-02-11: qty 2.5

## 2022-02-11 MED ORDER — ORAL CARE MOUTH RINSE: 15.0000 mL | Freq: Once | OROMUCOSAL | Status: AC

## 2022-02-11 MED ORDER — OXYCODONE HCL 5 MG PO TABS
ORAL_TABLET | ORAL | Status: AC
  Filled 2022-02-11: qty 1

## 2022-02-11 MED ORDER — STERILE WATER FOR IRRIGATION IR SOLN
Status: DC | PRN
  Administered 2022-02-11: 5000 mL

## 2022-02-11 MED ORDER — OXYCODONE HCL 5 MG/5ML PO SOLN: 5.0000 mg | Freq: Once | ORAL | Status: AC | PRN

## 2022-02-11 MED ORDER — FENTANYL CITRATE (PF) 100 MCG/2ML IJ SOLN
INTRAMUSCULAR | Status: DC | PRN
  Administered 2022-02-11 (×2): 50 ug via INTRAVENOUS

## 2022-02-11 MED ORDER — CEFAZOLIN SODIUM-DEXTROSE 2-4 GM/100ML-% IV SOLN
INTRAVENOUS | Status: AC
  Filled 2022-02-11: qty 100

## 2022-02-11 MED ORDER — CEFAZOLIN SODIUM-DEXTROSE 2-4 GM/100ML-% IV SOLN
2.0000 g | INTRAVENOUS | Status: AC
  Administered 2022-02-11: 2 g via INTRAVENOUS

## 2022-02-11 MED ORDER — SODIUM CHLORIDE 0.9 % IV SOLN: INTRAVENOUS | Status: AC

## 2022-02-11 MED ORDER — OXYBUTYNIN CHLORIDE 5 MG PO TABS: 5.0000 mg | ORAL_TABLET | Freq: Three times a day (TID) | ORAL | Status: DC | PRN

## 2022-02-11 MED ORDER — LACTATED RINGERS IV SOLN: INTRAVENOUS | Status: DC

## 2022-02-11 MED ORDER — LOSARTAN POTASSIUM 25 MG PO TABS
25.0000 mg | ORAL_TABLET | Freq: Every day | ORAL | Status: DC
  Administered 2022-02-12: 25 mg via ORAL
  Filled 2022-02-11: qty 1

## 2022-02-11 MED ORDER — SODIUM CHLORIDE 0.9 % IR SOLN
3000.0000 mL | Status: DC
  Administered 2022-02-11: 3000 mL

## 2022-02-11 MED ORDER — LIDOCAINE 2% (20 MG/ML) 5 ML SYRINGE
INTRAMUSCULAR | Status: DC | PRN
  Administered 2022-02-11: 60 mg via INTRAVENOUS

## 2022-02-11 MED ORDER — ONDANSETRON HCL 4 MG/2ML IJ SOLN
INTRAMUSCULAR | Status: DC | PRN
  Administered 2022-02-11: 4 mg via INTRAVENOUS

## 2022-02-11 SURGICAL SUPPLY — 26 items
BAG DRN RND TRDRP ANRFLXCHMBR (UROLOGICAL SUPPLIES) ×1
BAG URINE DRAIN 2000ML AR STRL (UROLOGICAL SUPPLIES) ×1 IMPLANT
CANISTER SUCT 3000ML PPV (MISCELLANEOUS) ×1 IMPLANT
CATH HEMA 3WAY 30CC 22FR COUDE (CATHETERS) IMPLANT
CATH HEMA 3WAY 30CC 24FR COUDE (CATHETERS) IMPLANT
COVER MAYO STAND STRL (DRAPES) ×1 IMPLANT
DRAPE FOOT SWITCH (DRAPES) ×1 IMPLANT
GEL ULTRASOUND 8.5O AQUASONIC (MISCELLANEOUS) ×1 IMPLANT
GLOVE SURG LX STRL 7.5 STRW (GLOVE) ×1 IMPLANT
GOWN STRL REUS W/ TWL XL LVL3 (GOWN DISPOSABLE) ×1 IMPLANT
GOWN STRL REUS W/TWL XL LVL3 (GOWN DISPOSABLE) ×1
HANDPIECE AQUABEAM (MISCELLANEOUS) ×1 IMPLANT
HOLDER FOLEY CATH W/STRAP (MISCELLANEOUS) IMPLANT
LOOP CUT BIPOLAR 24F LRG (ELECTROSURGICAL) IMPLANT
MANIFOLD NEPTUNE II (INSTRUMENTS) ×1 IMPLANT
MAT ABSORB  FLUID 56X50 GRAY (MISCELLANEOUS) ×1
MAT ABSORB FLUID 56X50 GRAY (MISCELLANEOUS) ×1 IMPLANT
PACK CYSTO (CUSTOM PROCEDURE TRAY) ×1 IMPLANT
PACK DRAPE AQUABEAM (MISCELLANEOUS) ×1 IMPLANT
SYR 30ML LL (SYRINGE) ×1 IMPLANT
SYR TOOMEY IRRIG 70ML (MISCELLANEOUS) ×2
SYRINGE TOOMEY IRRIG 70ML (MISCELLANEOUS) ×2 IMPLANT
TOWEL OR 17X24 6PK STRL BLUE (TOWEL DISPOSABLE) ×1 IMPLANT
TUBING CONNECTING 10 (TUBING) ×1 IMPLANT
TUBING UROLOGY SET (TUBING) ×1 IMPLANT
UNDERPAD 30X36 HEAVY ABSORB (UNDERPADS AND DIAPERS) ×1 IMPLANT

## 2022-02-11 NOTE — Plan of Care (Signed)
  Problem: Education: Goal: Knowledge of the prescribed therapeutic regimen will improve Outcome: Progressing   Problem: Skin Integrity: Goal: Demonstration of wound healing without infection will improve Outcome: Progressing   Problem: Urinary Elimination: Goal: Ability to avoid or minimize complications of infection will improve Outcome: Progressing   Problem: Education: Goal: Knowledge of General Education information will improve Description: Including pain rating scale, medication(s)/side effects and non-pharmacologic comfort measures Outcome: Progressing   Problem: Clinical Measurements: Goal: Diagnostic test results will improve Outcome: Progressing   Problem: Safety: Goal: Ability to remain free from injury will improve Outcome: Progressing

## 2022-02-11 NOTE — Op Note (Signed)
Preoperative diagnosis: BPH with lower urinary tract symptoms, weak stream, frequency  Postoperative diagnosis: Same   Procedure: Robotic water jet ablation of the prostate   Surgeon: Junious Silk   Anesthesia: General   Indication for procedure:  Jesus Warner is an 87 year old with a history of symptomatic BPH on maximal medical therapy.  He elected to proceed with surgical therapy.   Findings:  On exam under anesthesia prostate was about 80 g and smooth without hard area or nodule.  On post ablation exam, his abdomen was soft and nontender.  The irrigation and aspiration was equal the entire time during the ablation as well as during the TURP/fulguration portion of the procedure.  On final rectal exam, again prostate palpably normal.  Rectum palpably normal with no injury noted.  No blood on gloved finger.  Cystoscopy revealed lateral and median lobe hypertrophy.  Post ablation trigone ureteral orifice ease appeared normal.  Behind the trigone had been tented up by the transrectal ultrasound and the aqua beam just hit the back of the bladder before it resected the median lobe.  Concern for small perforation in the midline posterior inferior.  The  Cystogram-I filled the bladder with 300 cc of contrast.  Bladder distended appropriately.  Description of procedure:  He was brought to the operating room and placed supine on the operating table.  After adequate anesthesia he was placed lithotomy position. Timeout was performed to confirm the patient and procedure. The TRUS Stepper was mounted to the Articulating Arm and secured to OR bed. The ultrasound probe was attached to the stepper. Exam under anesthesia was performed and the TRUS was inserted per rectum.  There was no resistance. The ultrasound probe was aligned, and confirmation made that the prostate is centered and aligned using both transverse and sagittal views. The bladder neck, verumontanum and the central/transition zones were identified.   Genitalia were prepped and draped in the usual sterile fashion. The 44F AQUABEAM Handpiece is inserted into the prostatic urethra and a complete cystoscopic evaluation was performed by inspecting the prostate, bladder, and identifying the location of the verumontanum/external sphincter.  In order to get the handpiece in, I did have to drop or angle the transrectal ultrasound slightly down and provide more anterior compression.  The AQUABEAM Handpiece was secured to the Handpiece Articulating Arm. Confirmed alignment of AQUABEAM Handpiece and TRUS Probe to be parallel and colinear. Confirmation that AQUABEAM nozzle is centered and anterior of the bladder neck or the median lobe. The cystoscope was then retracted to visualize the verumontanum and external sphincter and the cystoscope tip was positioned just proximal to the external sphincter. Reconfirmed alignment of the TRUS probe with the AQUABEAM Handpiece and compression applied with TRUS probe. Horizontal alignment of the Handpiece waterjet nozzle was performed. The Aquablation treatment zones were planned utilizing real-time TRUS to visualize the contour of the median lobe, prostate and the depth and radial angles of resection were defined in the transverse view. In the sagittal view, the AQUABEAM nozzle is identified and position registered with software. The treatment contours were then adjusted to conform to the intended resection margins. The median lobe, bladder neck and verumontanum were marked and confirmed in the treatment contour. The Aquablation Treatment was then started following the resection contour confirmed under ultrasound guidance.  TOTAL AQUABLATION RESECTION TIME: about 8 minutes for two passes  Once Aquablation resection was complete the 24 French aqua beam handpiece was carefully removed.  The continuous-flow sheath with the visual obturator was passed and then the  loop and handle.  The trigone and the ureteral orifices were identified.   The back of the bladder showed some bleeding and edema few centimeters above the trigone where the aqua ablation likely hit the posterior bladder.  Concern for a small posterior perforation.  Resection of some of the residual median lobe and bladder neck tissue was done first up the right side and then the left.  The bladder neck was identified at 6:00 and this was taken up to 12:00 with fulguration of the bladder neck and prostate for hemostasis.  Slight amount of anterior tissue was resected.  Similarly from 6:00 up to 12:00 on the left side of the bladder neck was identified by resecting some of the ablated tissue to identify the bladder neck and cauterize any bleeding.  Some anterior tissue on the left was resected.  This created excellent hemostasis.  All the chips were evacuated.  Irrigation flow equal.  Abdomen soft.  At the verumontanum resection went back and stopped right at the.  Leaving the apical protection zone intact.  No ablation beyond the verumontanum.  Ureteral orifices again identified and noted to be normal without injury.  The scope was backed out and a 22 [place] hematuria catheter was placed with 30 cc in the balloon.  The balloon was seated at the bladder neck and it was irrigated on light traction and noted to be clear to pink.    C arm was brought into the room.  Scout imaging obtained.  Bladder was filled with 300 cc contrast and then emptied.  Images were obtained with filling and emptying.  There may be a small amount of contrast extravasation in the retroperitoneum noted.  No bowel loops noted.  He was hooked up to CBI.  He was cleaned up and placed supine.  Catheter was placed on traction.  DRE was performed.  He was awakened and taken to the cover room in stable condition.  Complications: Posterior bladder perforation  Blood loss: 50 mL  Specimens: TURP chips   Drains: 54 French three-way hematuria catheter with 30 cc in the balloon  Disposition: Patient stable to  PACU

## 2022-02-11 NOTE — Progress Notes (Signed)
Method is doing well without complaint.  Saw him about 130 this afternoon and at 5 PM.  He has been well.  CBI on a very slow drip with clear urine.  Vitals:   02/11/22 1415 02/11/22 1637  BP:  98/69  Pulse:  75  Resp:  18  Temp: (!) 95.5 F (35.3 C) 97.8 F (36.6 C)  SpO2:  94%   He has no chills, chest pain or shortness of breath.  He does not feel cold.  He is watching TV and alert and oriented. Abdomen soft and nontender GU-Foley in place with clear urine on a slow CBI drip.  Grade 1-2 hematuria.  Assessment/plan: Status post water jet ablation of prostate with small posterior bladder perforation- Continue to monitor overnight and minimize CBI.  Discussed with nurse.  He should do well and be discharged home in the morning with the Foley catheter.  I will see him back on February 22 with a cystogram and voiding trial.

## 2022-02-11 NOTE — H&P (Signed)
H&P  Chief Complaint: BPH with lower urinary tract symptoms  History of Present Illness: Jesus Warner is an 87 year old male with a history of BPH.  Is been on tamsulosin and finasteride.  He continues to have a weak stream, frequency and urgency.  Prostate ultrasound revealed 127 g prostate and cystoscopy revealed lateral lobe hypertrophy and a bilateral valve median lobe.  He presents for robotic water jet ablation of the prostate.  He has been well.  No cough cold congestion or fever.  No dysuria or gross hematuria.  He has been off his aspirin for a week.  His PSA was 0.6 back in 2017.  Past Medical History:  Diagnosis Date   Arthritis    BPH (benign prostatic hyperplasia)    CAD (coronary artery disease)    a. Anterior STEMI (9/14) => s/p DES-mid LAD (LHC 09/06/12: LAD occluded, after reperfusion distal LAD 40%, oCFX 80%, RCA with minimal irregularities, EF 40% with distal anterior, apical and infra-apical AK. PCI: Xience (3 x 18 mm) DES to the mid LAD. CFX was a small vessel and medical therapy was planned)   Chronic systolic CHF (congestive heart failure) (HCC)    GERD (gastroesophageal reflux disease)    HTN (hypertension)    Hyperlipidemia    Ischemic cardiomyopathy    a.  Echocardiogram 09/06/12: EF 25-30%, mid to apical anteroseptal and inferoseptal AK, apical lateral AK, mid to apical anterior severe HK, true apex AK, grade 1 diastolic dysfunction, trivial MR, mildly reduced RVSF, PASP 38;   b. Echo (12/2012): Mild LVH, EF 45-50%, PASP 40   Myocardial infarction (HCC)    Pneumonia    Primary osteoarthritis of shoulder    Right   Seasonal allergies    Past Surgical History:  Procedure Laterality Date   CORONARY ANGIOPLASTY WITH STENT PLACEMENT  09/06/2012   total mid LAD occlusion s/p DES, 40% distal LAD, 80% ostial LCx (small vessel supplying single OM branch), widely patent RCA; akinesis of distal anterior, apical and inferoapical walls; EF 40%   FOOT SURGERY     LEFT HEART  CATHETERIZATION WITH CORONARY ANGIOGRAM N/A 09/06/2012   Procedure: LEFT HEART CATHETERIZATION WITH CORONARY ANGIOGRAM;  Surgeon: [name], MD;  Location: [facility] C[facility] LAB;  Service: Cardiovascular;  Laterality: N/A;   PERCUTANEOUS CORONARY STENT INTERVENTION (PCI-S)  09/06/2012   Procedure: PERCUTANEOUS CORONARY STENT INTERVENTION (PCI-S);  Surgeon: [name], MD;  Location: [facility] CATH LAB;  Service: Cardiovascular;;   TOTAL SHOULDER ARTHROPLASTY Right 05/25/2017   Procedure: RIGHT TOTAL SHOULDER ARTHROPLASTY;  Surgeon: Tania Ade, MD;  Location: [place];  Service: Orthopedics;  Laterality: Right;    Home Medications:  Medications Prior to Admission  Medication Sig Dispense Refill Last Dose   Ascorbic Acid (VITAMIN C) 1000 MG tablet Take 1,000 mg by mouth daily.   01/30/2022   aspirin 81 MG tablet Take 81 mg by mouth daily.   02/06/2022   atorvastatin (LIPITOR) 80 MG tablet TAKE 1 TABLET(80 MG) BY MOUTH DAILY 90 tablet 3 02/10/2022   carvedilol (COREG) 6.25 MG tablet TAKE 1 TABLET BY MOUTH TWICE DAILY WITH MEALS 60 tablet 9 02/10/2022   famotidine (PEPCID) 20 MG tablet Take 20 mg by mouth in the morning.   02/10/2022   finasteride (PROSCAR) 5 MG tablet Take 5 mg by mouth in the morning.  0 02/10/2022   fluticasone (FLONASE) 50 MCG/ACT nasal spray Place 1 spray into both nostrils in the morning.   02/10/2022   latanoprost (XALATAN) 0.005 % ophthalmic solution Place 1  drop into both eyes at bedtime.   02/10/2022   losartan (COZAAR) 25 MG tablet Take 25 mg by mouth daily.   02/10/2022   nitroGLYCERIN (NITROSTAT) 0.4 MG SL tablet Place 1 tablet (0.4 mg total) under the tongue every 5 (five) minutes x 3 doses as needed for chest pain. 25 tablet 6    spironolactone (ALDACTONE) 25 MG tablet TAKE 1 TABLET(25 MG) BY MOUTH DAILY 90 tablet 0 02/10/2022   tamsulosin (FLOMAX) 0.4 MG CAPS capsule Take 0.4 mg by mouth every evening.  11 02/10/2022   Allergies: No Known Allergies  Family History  Problem Relation Age  of Onset   Emphysema Father        died @ 34   Cancer Mother        died @ 25   Diabetes Mother    Cancer Maternal Aunt    Cancer Maternal Aunt    Social History:  reports that he has never smoked. He has never used smokeless tobacco. He reports that he does not drink alcohol and does not use drugs.  ROS: A complete review of systems was performed.  All systems are negative except for pertinent findings as noted. Review of Systems  All other systems reviewed and are negative.    Physical Exam:  Vital signs in last 24 hours: Temp:  [97.8 F (36.6 C)] 97.8 F (36.6 C) (02/09 0540) Pulse Rate:  [53] 53 (02/09 0540) Resp:  [16] 16 (02/09 0540) BP: (127)/(64) 127/64 (02/09 0540) SpO2:  [98 %] 98 % (02/09 0540) Weight:  [68.6 kg] 68.6 kg (02/09 0557) General:  Alert and oriented, No acute distress HEENT: Normocephalic, atraumatic Cardiovascular: Regular rate and rhythm Lungs: Regular rate and effort Abdomen: Soft, nontender, nondistended, no abdominal masses Back: No CVA tenderness Extremities: No edema Neurologic: Grossly intact  Laboratory Data:  No results found for this or any previous visit (from the past 24 hour(s)). No results found for this or any previous visit (from the past 240 hour(s)). Creatinine: Recent Labs    02/04/22 1409  CREATININE 1.17    Impression/Assessment:  BPH, weak stream -   Plan:  I discussed with the patient the nature, potential benefits, risks and alternatives to robotic water jet ablation prostate, including side effects of the proposed treatment, the likelihood of the patient achieving the goals of the procedure, and any potential problems that might occur during the procedure or recuperation.  We discussed again expectations for flow symptoms and irritative symptoms.  I also discussed postop care and catheter management, CBI management.  All questions answered. Patient elects to proceed.    Jesus Warner 02/11/2022, 7:14 AM

## 2022-02-11 NOTE — Discharge Instructions (Signed)
Prostate Water Jet Surgery, Care After The following information offers guidance on how to care for yourself after your procedure. Your health care provider may also give you more specific instructions. If you have problems or questions, contact your health care provider. What can I expect after the procedure? After the procedure, it is common to have these symptoms for a few days: Swelling and discomfort around your urethra. Blood in your urine. A burning feeling when you urinate after the urinary catheter is removed. You will feel this especially at the end of urination. This feeling usually passes within 3-5 days. For the first few weeks after the procedure, you may have: A sudden need to urinate (urgency). A need to urinate often. Follow these instructions at home: Medicines Take over-the-counter and prescription medicines only as told by your health care provider. These medicines may include stool softeners. If you were prescribed an antibiotic medicine, take it as told by your health care provider. Do not stop taking the antibiotic even if you start to feel better. Bathing Do not take baths, swim, or use a hot tub until your health care provider approves. Ask your health care provider if you may take showers. You may only be allowed to take sponge baths. Activity  Rest as told by your health care provider. Do not drive or operate machinery until your health care provider says that it is safe. Do not ride in a car for long periods of time, or as told by your health care provider. Do not do strenuous exercises for 1 week or as told by your health care provider. Strenuous exercises are exercises that require a lot of effort. Do not lift anything that is heavier than 10 lb (4.5 kg), or the limit that you are told, until your health care provider says that it is safe. Avoid sex for 4-6 weeks, or as told by your health care provider. Return to your normal activities as told by your health  care provider. Ask your health care provider what activities are safe for you. Preventing constipation You may need to take these actions to prevent or treat constipation: Drink enough fluid to keep your urine pale yellow. Take over-the-counter or prescription medicines. Eat foods that are high in fiber, such as beans, whole grains, and fresh fruits and vegetables. Limit foods that are high in fat and processed sugars, such as fried or sweet foods. General instructions  Do not strain when you have a bowel movement. Straining may lead to bleeding from the prostate. This may cause blood clots and trouble urinating. Do not use any products that contain nicotine or tobacco. These products include cigarettes, chewing tobacco, and vaping devices, such as e-cigarettes. If you need help quitting, ask your health care provider. If you have a urinary catheter, care for it as told by your health care provider. Keep all follow-up visits. This is important. Contact a health care provider if: You have signs of infection such as: Fever or chills. Swelling around your urethra that is getting worse. Urine that smells very bad. Struggling to urinate or pain or burning when you urinate. You have trouble having a bowel movement. You have blood in your urine for more than 2 days after the procedure. You have trouble having or keeping an erection. No semen comes out during orgasm (dry ejaculation). You have a urinary catheter still in place and you have: Spasms or pain. Problems with the catheter or your catheter is blocked. Get help right away if: You  cannot urinate after your catheter is removed. Your urine is dark red or has blood clots in it. You have blood in your stool. You have severe pain that does not get better with medicine. You develop swelling or pain in your leg. You develop chest pains or shortness of breath. These symptoms may be an emergency. Get help right away. Call 911. Do not wait to  see if the symptoms will go away. Do not drive yourself to the hospital. Summary After the procedure, you may notice urinary symptoms for a few weeks. Follow instructions from your health care provider about restrictions for activities such as lifting, exercise, and sex. Contact your health care provider if you have a fever or chills, spasms or pain while the catheter is in place, or blood in your urine for more than 2 days after the procedure. This information is not intended to replace advice given to you by your health care provider. Make sure you discuss any questions you have with your health care provider. Document Revised: 09/11/2020 Document Reviewed: 09/11/2020 Elsevier Patient Education  [employer].

## 2022-02-11 NOTE — Anesthesia Postprocedure Evaluation (Signed)
Anesthesia Post Note  Patient: Jesus Warner  Procedure(s) Performed: TRANSURETHRAL WATERJET ABLATION OF PROSTATE     Patient location during evaluation: PACU Anesthesia Type: General Level of consciousness: awake Pain management: pain level controlled Vital Signs Assessment: post-procedure vital signs reviewed and stable Respiratory status: spontaneous breathing, nonlabored ventilation and respiratory function stable Cardiovascular status: blood pressure returned to baseline and stable Postop Assessment: no apparent nausea or vomiting Anesthetic complications: no   No notable events documented.  Last Vitals:  Vitals:   02/11/22 1115 02/11/22 1130  BP: 115/63 115/65  Pulse: (!) 47 (!) 45  Resp: 16 16  Temp:    SpO2: 100% 98%    Last Pain:  Vitals:   02/11/22 1130  TempSrc:   PainSc: 2                  Nilda Simmer

## 2022-02-11 NOTE — Progress Notes (Signed)
  Transition of Care [facility]) Screening Note   Patient Details  Name: Jesus Warner Date of Birth: 04-Sep-1935   Transition of [facility]) CM/SW Contact:    Dessa Phi, RN Phone Number: 02/11/2022, 1:53 PM    Transition of Care Department [facility]) has reviewed patient and no TOC needs have been identified at this time. We will continue to monitor patient advancement through interdisciplinary progression rounds. If new patient transition needs arise, please place a TOC consult.

## 2022-02-11 NOTE — Anesthesia Procedure Notes (Signed)
Procedure Name: Intubation Date/Time: 02/11/2022 7:51 AM  Performed by: Gean Maidens, CRNAPre-anesthesia Checklist: Patient identified, Emergency Drugs available, Suction available, Patient being monitored and Timeout performed Patient Re-evaluated:Patient Re-evaluated prior to induction Oxygen Delivery Method: Circle system utilized Preoxygenation: Pre-oxygenation with 100% oxygen Induction Type: IV induction Ventilation: Mask ventilation without difficulty Laryngoscope Size: Mac and 4 Grade View: Grade I Tube type: Oral Tube size: 7.5 mm Number of attempts: 1 Airway Equipment and Method: Stylet Placement Confirmation: ETT inserted through vocal cords under direct vision, positive ETCO2 and breath sounds checked- equal and bilateral Secured at: 23 cm Tube secured with: Tape Dental Injury: Teeth and Oropharynx as per pre-operative assessment

## 2022-02-11 NOTE — Transfer of Care (Signed)
Immediate Anesthesia Transfer of Care Note  Patient: Jesus Warner  Procedure(s) Performed: TRANSURETHRAL WATERJET ABLATION OF PROSTATE  Patient Location: PACU  Anesthesia Type:General  Level of Consciousness: sedated, patient cooperative, and responds to stimulation  Airway & Oxygen Therapy: Patient Spontanous Breathing and Patient connected to face mask oxygen  Post-op Assessment: Report given to RN and Post -op Vital signs reviewed and stable  Post vital signs: Reviewed and stable  Last Vitals:  Vitals Value Taken Time  BP 112/63 02/11/22 0943  Temp    Pulse 50 02/11/22 0944  Resp 21 02/11/22 0944  SpO2 100 % 02/11/22 0944  Vitals shown include unvalidated device data.  Last Pain:  Vitals:   02/11/22 0557  TempSrc:   PainSc: 0-No pain         Complications: No notable events documented.

## 2022-02-12 DIAGNOSIS — N320 Bladder-neck obstruction: Secondary | ICD-10-CM | POA: Diagnosis not present

## 2022-02-12 DIAGNOSIS — I5022 Chronic systolic (congestive) heart failure: Secondary | ICD-10-CM | POA: Diagnosis not present

## 2022-02-12 DIAGNOSIS — I2510 Atherosclerotic heart disease of native coronary artery without angina pectoris: Secondary | ICD-10-CM | POA: Diagnosis not present

## 2022-02-12 DIAGNOSIS — Z7982 Long term (current) use of aspirin: Secondary | ICD-10-CM | POA: Diagnosis not present

## 2022-02-12 DIAGNOSIS — R350 Frequency of micturition: Secondary | ICD-10-CM | POA: Diagnosis not present

## 2022-02-12 DIAGNOSIS — N401 Enlarged prostate with lower urinary tract symptoms: Secondary | ICD-10-CM | POA: Diagnosis not present

## 2022-02-12 DIAGNOSIS — R3912 Poor urinary stream: Secondary | ICD-10-CM | POA: Diagnosis not present

## 2022-02-12 DIAGNOSIS — I110 Hypertensive heart disease with heart failure: Secondary | ICD-10-CM | POA: Diagnosis not present

## 2022-02-12 DIAGNOSIS — Z79899 Other long term (current) drug therapy: Secondary | ICD-10-CM | POA: Diagnosis not present

## 2022-02-12 DIAGNOSIS — Z96611 Presence of right artificial shoulder joint: Secondary | ICD-10-CM | POA: Diagnosis not present

## 2022-02-12 MED ORDER — TRAMADOL HCL 50 MG PO TABS: 50.0000 mg | ORAL_TABLET | Freq: Four times a day (QID) | ORAL | 0 refills | Status: AC | PRN

## 2022-02-12 NOTE — Progress Notes (Signed)
1 Day Post-Op Subjective: Feeling well status post aqua ablation procedure.  Did have small bladder perforation.  Minimal pain.  Urine is light pink in color with minimal CBI  Objective: Vital signs in last 24 hours: Temp:  [95.5 F (35.3 C)-98.4 F (36.9 C)] 98 F (36.7 C) (02/10 0435) Pulse Rate:  [44-85] 68 (02/10 0620) Resp:  [14-21] 18 (02/10 0435) BP: (98-118)/(49-69) 104/55 (02/10 0435) SpO2:  [89 %-100 %] 96 % (02/10 0620)  Intake/Output from previous day: 02/09 0701 - 02/10 0700 In: 1905.4 [P.O.:360; I.V.:1045.4; IV Piggyback:100] Out: 7000 [Urine:6950; Blood:50] Intake/Output this shift: No intake/output data recorded.  Physical Exam:  General: Alert and oriented Abdomen: Soft, ND   Lab Results: No results for input(s): "HGB", "HCT" in the last 72 hours. BMET No results for input(s): "NA", "K", "CL", "CO2", "GLUCOSE", "BUN", "CREATININE", "CALCIUM" in the last 72 hours.   Studies/Results: DG C-Arm 1-60 Min-No Report  Result Date: 02/11/2022 Fluoroscopy was utilized by the requesting physician.  No radiographic interpretation.    Assessment/Plan: 1.  Status post aqua ablation for BPH with intraoperative bladder perforation, doing well Plan/recommendation plan for discontinuation of CBI and discharge home.  Follow-up to be arranged by Dr. Junious Silk within the next 2 weeks for Foley removal and voiding trial.    LOS: 0 days   Remi Haggard 02/12/2022, 7:49 AM

## 2022-02-12 NOTE — Discharge Summary (Signed)
Date of admission: 02/11/2022  Date of discharge: 02/12/2022  Admission diagnosis: BPH with bladder outlet obstruction  Discharge diagnosis: BPH with bladder outlet obstruction  Secondary diagnoses:   History and Physical: For full details, please see admission history and physical. Briefly, Jesus Warner is a 87 y.o. year old patient with BPH and bladder outlet obstruction.  Presents this time undergo cystoscopy and aqua ablation for definitive management for his bladder outlet obstruction.Marland Kitchen   Hospital Course: Patient was admitted on 02/11/2022 after undergoing cystoscopy and aqua ablation procedure by Dr. Junious Silk.  For details procedure please the typed operative note.  Patient did have intraoperative bladder perforation noted treated conservatively with Foley catheter drainage.  Patient's urine was minimally pink in color on essentially 0 CBI first postoperative morning was feeling well with minimal discomfort.  He felt ready for discharge home.  He will be discharged home on his routine preoperative medications.  Follow-up will be made with Dr. Junious Silk in approximately 2 weeks for voiding trial.  Dr. Junious Silk staff will contact the patient to arrange follow-up  Laboratory values: No results for input(s): "HGB", "HCT" in the last 72 hours. No results for input(s): "CREATININE" in the last 72 hours.  Disposition: Home  Discharge instruction: The patient was instructed to be ambulatory but told to refrain from heavy lifting, strenuous activity, or driving.   Discharge medications:  Allergies as of 02/12/2022   No Known Allergies      Medication List     TAKE these medications    aspirin 81 MG tablet Take 81 mg by mouth daily.   atorvastatin 80 MG tablet Commonly known as: LIPITOR TAKE 1 TABLET(80 MG) BY MOUTH DAILY   carvedilol 6.25 MG tablet Commonly known as: COREG TAKE 1 TABLET BY MOUTH TWICE DAILY WITH MEALS   cephALEXin 500 MG capsule Commonly known as: KEFLEX Take 1  capsule (500 mg total) by mouth at bedtime.   famotidine 20 MG tablet Commonly known as: PEPCID Take 20 mg by mouth in the morning.   finasteride 5 MG tablet Commonly known as: PROSCAR Take 5 mg by mouth in the morning.   fluticasone 50 MCG/ACT nasal spray Commonly known as: FLONASE Place 1 spray into both nostrils in the morning.   latanoprost 0.005 % ophthalmic solution Commonly known as: XALATAN Place 1 drop into both eyes at bedtime.   losartan 25 MG tablet Commonly known as: COZAAR Take 25 mg by mouth daily.   nitroGLYCERIN 0.4 MG SL tablet Commonly known as: NITROSTAT Place 1 tablet (0.4 mg total) under the tongue every 5 (five) minutes x 3 doses as needed for chest pain.   spironolactone 25 MG tablet Commonly known as: ALDACTONE TAKE 1 TABLET(25 MG) BY MOUTH DAILY   tamsulosin 0.4 MG Caps capsule Commonly known as: FLOMAX Take 0.4 mg by mouth every evening.   traMADol 50 MG tablet Commonly known as: Ultram Take 1 tablet (50 mg total) by mouth every 6 (six) hours as needed.   vitamin C 1000 MG tablet Take 1,000 mg by mouth daily.        Followup:   Follow-up Information     Festus Aloe, MD Follow up on 02/24/2022.   Specialty: Urology Why: at 8 AM with Dr. Jarvis Morgan information: [address] [id number] [phone]

## 2022-02-14 LAB — SURGICAL PATHOLOGY

## 2022-02-24 ENCOUNTER — Other Ambulatory Visit: Payer: Self-pay | Admitting: Cardiovascular Disease

## 2022-02-24 DIAGNOSIS — N401 Enlarged prostate with lower urinary tract symptoms: Secondary | ICD-10-CM | POA: Diagnosis not present

## 2022-02-24 DIAGNOSIS — R3912 Poor urinary stream: Secondary | ICD-10-CM | POA: Diagnosis not present

## 2022-03-07 DIAGNOSIS — N401 Enlarged prostate with lower urinary tract symptoms: Secondary | ICD-10-CM | POA: Diagnosis not present

## 2022-03-07 DIAGNOSIS — R351 Nocturia: Secondary | ICD-10-CM | POA: Diagnosis not present

## 2022-03-07 DIAGNOSIS — R3912 Poor urinary stream: Secondary | ICD-10-CM | POA: Diagnosis not present

## 2022-04-05 ENCOUNTER — Encounter: Payer: Self-pay | Admitting: Cardiovascular Disease

## 2022-04-05 ENCOUNTER — Ambulatory Visit: Payer: Medicare Other | Attending: Cardiovascular Disease | Admitting: Cardiovascular Disease

## 2022-04-05 VITALS — BP 108/60 | HR 58 | Ht 70.0 in | Wt 153.2 lb

## 2022-04-05 DIAGNOSIS — I10 Essential (primary) hypertension: Secondary | ICD-10-CM | POA: Diagnosis not present

## 2022-04-05 DIAGNOSIS — I5022 Chronic systolic (congestive) heart failure: Secondary | ICD-10-CM

## 2022-04-05 DIAGNOSIS — I2510 Atherosclerotic heart disease of native coronary artery without angina pectoris: Secondary | ICD-10-CM | POA: Diagnosis not present

## 2022-04-05 DIAGNOSIS — E782 Mixed hyperlipidemia: Secondary | ICD-10-CM | POA: Diagnosis not present

## 2022-04-05 NOTE — Progress Notes (Signed)
Cardiology Office Note:    Date:  04/05/2022   ID:  Jesus Warner, DOB 1935/05/29, MRN [id number]  PCP:  Seward Carol, [place] Prov[facility] Cardiologist:  [name], MD     Referring MD: Seward Carol, MD   Chief Complaint  Patient presents with   Coronary Artery Disease    History of Present Illness:    Jesus Warner is a 87 y.o. male with a hx of coronary artery disease, systolic CHF 2/2 ischemic CM, hypertension, hyperlipidemia, LBBB.  He presented with an anterior MI in 09/2012 that was treated with a DES to the LAD.  His EF was initially 25-30 but improved to 45-50 on follow-up imaging.   The patient is here alone today.  He has been doing well from a cardiac perspective.  He has had some problems with BPH and had to have an indwelling Foley catheter for a few weeks.  Other than that, he reports no intercurrent medical problems since his last visit here 1 year ago.  He denies any chest pain, chest pressure, or shortness of breath.  He is had no leg edema, heart palpitations, orthopnea, or PND.  Past Medical History:  Diagnosis Date   Arthritis    BPH (benign prostatic hyperplasia)    CAD (coronary artery disease)    a. Anterior STEMI (9/14) => s/p DES-mid LAD (LHC 09/06/12: LAD occluded, after reperfusion distal LAD 40%, oCFX 80%, RCA with minimal irregularities, EF 40% with distal anterior, apical and infra-apical AK. PCI: Xience (3 x 18 mm) DES to the mid LAD. CFX was a small vessel and medical therapy was planned)   Chronic systolic CHF (congestive heart failure)    GERD (gastroesophageal reflux disease)    HTN (hypertension)    Hyperlipidemia    Ischemic cardiomyopathy    a.  Echocardiogram 09/06/12: EF 25-30%, mid to apical anteroseptal and inferoseptal AK, apical lateral AK, mid to apical anterior severe HK, true apex AK, grade 1 diastolic dysfunction, trivial MR, mildly reduced RVSF, PASP 38;   b. Echo (12/2012): Mild LVH, EF 45-50%, PASP 40    Myocardial infarction    Pneumonia    Primary osteoarthritis of shoulder    Right   Seasonal allergies     Past Surgical History:  Procedure Laterality Date   CORONARY ANGIOPLASTY WITH STENT PLACEMENT  09/06/2012   total mid LAD occlusion s/p DES, 40% distal LAD, 80% ostial LCx (small vessel supplying single OM branch), widely patent RCA; akinesis of distal anterior, apical and inferoapical walls; EF 40%   FOOT SURGERY     LEFT HEART CATHETERIZATION WITH CORONARY ANGIOGRAM N/A 09/06/2012   Procedure: LEFT HEART CATHETERIZATION WITH CORONARY ANGIOGRAM;  Surgeon: [name], MD;  Location: [facility] CATH LAB;  Service: Cardiovascular;  Laterality: N/A;   PERCUTANEOUS CORONARY STENT INTERVENTION (PCI-S)  09/06/2012   Procedure: PERCUTANEOUS CORONARY STENT INTERVENTION (PCI-S);  Surgeon: [name], MD;  Location: [facility] CATH LAB;  Service: Cardiovascular;;   TOTAL SHOULDER ARTHROPLASTY Right 05/25/2017   Procedure: RIGHT TOTAL SHOULDER ARTHROPLASTY;  Surgeon: Tania Ade, MD;  Location: [facility];  Service: Orthopedics;  Laterality: Right;    Current Medications: Current Meds  Medication Sig   Ascorbic Acid (VITAMIN C) 1000 MG tablet Take 1,000 mg by mouth daily.   aspirin 81 MG tablet Take 81 mg by mouth daily.   atorvastatin (LIPITOR) 80 MG tablet TAKE 1 TABLET(80 MG) BY MOUTH DAILY   carvedilol (COREG) 6.25 MG tablet TAKE 1 TABLET  BY MOUTH TWICE DAILY WITH MEALS   cetirizine (ZYRTEC) 10 MG tablet Take by mouth as needed.   famotidine (PEPCID) 20 MG tablet Take 20 mg by mouth in the morning.   finasteride (PROSCAR) 5 MG tablet Take 5 mg by mouth in the morning.   fluticasone (FLONASE) 50 MCG/ACT nasal spray Place 1 spray into both nostrils in the morning.   latanoprost (XALATAN) 0.005 % ophthalmic solution Place 1 drop into both eyes at bedtime.   losartan (COZAAR) 25 MG tablet Take 25 mg by mouth daily.   nitroGLYCERIN (NITROSTAT) 0.4 MG SL tablet Place 1 tablet (0.4 mg total) under  the tongue every 5 (five) minutes x 3 doses as needed for chest pain.   spironolactone (ALDACTONE) 25 MG tablet TAKE 1 TABLET(25 MG) BY MOUTH DAILY   tamsulosin (FLOMAX) 0.4 MG CAPS capsule Take 0.4 mg by mouth every evening.   traMADol (ULTRAM) 50 MG tablet Take 1 tablet (50 mg total) by mouth every 6 (six) hours as needed.   [DISCONTINUED] cephALEXin (KEFLEX) 500 MG capsule Take 1 capsule (500 mg total) by mouth at bedtime.     Allergies:   Patient has no known allergies.   Social History   Socioeconomic History   Marital status: Married    Spouse name: Not on file   Number of children: Not on file   Years of education: Not on file   Highest education level: Not on file  Occupational History   Not on file  Tobacco Use   Smoking status: Never   Smokeless tobacco: Never  Vaping Use   Vaping Use: Never used  Substance and Sexual Activity   Alcohol use: No   Drug use: No   Sexual activity: Not on file  Other Topics Concern   Not on file  Social History Narrative   Lives at home with wife in [place] Determinants of Health   Financial Resource Strain: Not on file  Food Insecurity: No Food Insecurity (02/11/2022)   Hunger Vital Sign    Worried About Running Out of Food in the Last Year: Never true    Ran Out of Food in the Last Year: Never true  Transportation Needs: No Transportation Needs (02/11/2022)   PRAPARE - Hydrologist (Medical): No    Lack of Transportation (Non-Medical): No  Physical Activity: Not on file  Stress: Not on file  Social Connections: Not on file     Family History: The patient's family history includes Cancer in his maternal aunt, maternal aunt, and mother; Diabetes in his mother; Emphysema in his father.  ROS:   Please see the history of present illness.    All other systems reviewed and are negative.  EKGs/Labs/Other Studies Reviewed:    The following studies were reviewed today: Cardiac Studies &  Procedures       ECHOCARDIOGRAM  ECHOCARDIOGRAM COMPLETE 07/15/2019  Narrative ECHOCARDIOGRAM REPORT    Patient Name:   Jesus Warner [facility] Date of Exam: 07/15/2019 Medical Rec #:  [id number]        Height:       70.0 in Accession #:    [id number]       Weight:       163.0 lb Date of Birth:  10-05-1935        BSA:          1.914 m Patient Age:    85 years  BP:           112/66 mmHg Patient Gender: M                HR:           49 bpm. Exam Location:  [facility]  Procedure: 2D Echo, 3D Echo, Cardiac Doppler, Color Doppler and Strain Analysis  Indications:    I50.22 CHF  History:        Patient has prior history of Echocardiogram examinations, most recent 04/06/2015. Cardiomyopathy, Previous Myocardial Infarction and CAD, Arrythmias:LBBB; Risk Factors:Hypertension and Dyslipidemia.  Sonographer:    Jessee Avers, RDCS Referring Phys: [place]   1. There has been no significant change since the prior study on 04/06/2015. 2. Left ventricular ejection fraction, by estimation, is 40 to 45%. The left ventricle has mildly decreased function. The left ventricle demonstrates global hypokinesis. Left ventricular diastolic parameters are consistent with Grade I diastolic dysfunction (impaired relaxation). 3. Right ventricular systolic function is normal. The right ventricular size is normal. There is mildly elevated pulmonary artery systolic pressure. The estimated right ventricular systolic pressure is 123XX123 mmHg. 4. The mitral valve is normal in structure. No evidence of mitral valve regurgitation. No evidence of mitral stenosis. 5. Tricuspid valve regurgitation is moderate. 6. The aortic valve is normal in structure. Aortic valve regurgitation is not visualized. No aortic stenosis is present. 7. The inferior vena cava is normal in size with greater than 50% respiratory variability, suggesting right atrial pressure of 3 mmHg.  Comparison(s): 04/06/15 EF 40-45%.  PA pressure 18mmHg.  FINDINGS Left Ventricle: Left ventricular ejection fraction, by estimation, is 40 to 45%. The left ventricle has mildly decreased function. The left ventricle demonstrates global hypokinesis. The left ventricular internal cavity size was normal in size. There is no left ventricular hypertrophy. Abnormal (paradoxical) septal motion, consistent with left bundle branch block. Left ventricular diastolic parameters are consistent with Grade I diastolic dysfunction (impaired relaxation). Normal left ventricular filling pressure.   LV Wall Scoring: The entire anterior septum is hypokinetic.  Right Ventricle: The right ventricular size is normal. No increase in right ventricular wall thickness. Right ventricular systolic function is normal. There is mildly elevated pulmonary artery systolic pressure. The tricuspid regurgitant velocity is 2.90 m/s, and with an assumed right atrial pressure of 3 mmHg, the estimated right ventricular systolic pressure is 123XX123 mmHg.  Left Atrium: Left atrial size was normal in size.  Right Atrium: Right atrial size was normal in size.  Pericardium: There is no evidence of pericardial effusion.  Mitral Valve: The mitral valve is normal in structure. Normal mobility of the mitral valve leaflets. No evidence of mitral valve regurgitation. No evidence of mitral valve stenosis.  Tricuspid Valve: The tricuspid valve is normal in structure. Tricuspid valve regurgitation is moderate . No evidence of tricuspid stenosis.  Aortic Valve: The aortic valve is normal in structure. Aortic valve regurgitation is not visualized. No aortic stenosis is present.  Pulmonic Valve: The pulmonic valve was normal in structure. Pulmonic valve regurgitation is not visualized. No evidence of pulmonic stenosis.  Aorta: The aortic root is normal in size and structure.  Venous: The inferior vena cava is normal in size with greater than 50% respiratory variability, suggesting  right atrial pressure of 3 mmHg.  IAS/Shunts: No atrial level shunt detected by color flow Doppler.   LEFT VENTRICLE PLAX 2D LVIDd:         4.70 cm  Diastology LVIDs:  3.40 cm  LV e' lateral:   11.40 cm/s LV PW:         0.80 cm  LV E/e' lateral: 3.6 LV IVS:        0.80 cm  LV e' medial:    3.89 cm/s LVOT diam:     2.00 cm  LV E/e' medial:  10.5 LV SV:         61 LV SV Index:   32       2D Longitudinal Strain LVOT Area:     3.14 cm 2D Strain GLS (A2C):   -21.8 % 2D Strain GLS (A3C):   -15.6 % 2D Strain GLS (A4C):   -22.2 % 2D Strain GLS Avg:     -19.9 %  3D Volume EF: 3D EF:        51 % LV EDV:       112 ml LV ESV:       55 ml LV SV:        57 ml  RIGHT VENTRICLE RV Basal diam:  2.40 cm RV S prime:     8.76 cm/s TAPSE (M-mode): 1.7 cm RVSP:           36.6 mmHg  LEFT ATRIUM             Index       RIGHT ATRIUM           Index LA diam:        3.40 cm 1.78 cm/m  RA Pressure: 3.00 mmHg LA Vol (A2C):   18.0 ml 9.41 ml/m  RA Area:     11.00 cm LA Vol (A4C):   25.5 ml 13.33 ml/m RA Volume:   22.40 ml  11.71 ml/m LA Biplane Vol: 22.8 ml 11.92 ml/m AORTIC VALVE LVOT Vmax:   69.20 cm/s LVOT Vmean:  46.900 cm/s LVOT VTI:    0.194 m  AORTA Ao Root diam: 4.00 cm Ao Asc diam:  3.70 cm  MITRAL VALVE               TRICUSPID VALVE TR Peak grad:   33.6 mmHg TR Vmax:        290.00 cm/s MV E velocity: 40.70 cm/s  Estimated RAP:  3.00 mmHg MV A velocity: 61.30 cm/s  RVSP:           36.6 mmHg MV E/A ratio:  0.66 SHUNTS Systemic VTI:  0.19 m Systemic Diam: 2.00 cm  Ena Dawley MD Electronically signed by Ena Dawley MD Signature Date/Time: 07/15/2019/3:50:59 PM    Final             EKG:  EKG is ordered today.  The ekg ordered today demonstrates sinus bradycardia 58 bpm with nonspecific intraventricular block (QRS 132 ms)  Recent Labs: 02/04/2022: BUN 23; Creatinine, Ser 1.17; Hemoglobin 11.5; Platelets 186; Potassium 4.4; Sodium 136  Recent Lipid  Panel    Component Value Date/Time   CHOL 166 12/23/2013 0805   TRIG 103.0 12/23/2013 0805   HDL 38.10 (L) 12/23/2013 0805   CHOLHDL 4 12/23/2013 0805   VLDL 20.6 12/23/2013 0805   LDLCALC 107 (H) 12/23/2013 0805     Risk Assessment/Calculations:                Physical Exam:    VS:  BP 108/60   Pulse (!) 58   Ht 5\' 10"  (1.778 m)   Wt 153 lb 3.2 oz (69.5 kg)   SpO2 99%   BMI 21.98 kg/m  Wt Readings from Last 3 Encounters:  04/05/22 153 lb 3.2 oz (69.5 kg)  02/11/22 151 lb 3.8 oz (68.6 kg)  02/04/22 151 lb 3.2 oz (68.6 kg)     GEN:  Well nourished, well developed in no acute distress HEENT: Normal NECK: No JVD; No carotid bruits LYMPHATICS: No lymphadenopathy CARDIAC: RRR, no murmurs, rubs, gallops RESPIRATORY:  Clear to auscultation without rales, wheezing or rhonchi  ABDOMEN: Soft, non-tender, non-distended MUSCULOSKELETAL:  No edema; No deformity  SKIN: Warm and dry NEUROLOGIC:  Alert and oriented x 3 PSYCHIATRIC:  Normal affect   ASSESSMENT:    1. Coronary artery disease involving native coronary artery of native heart without angina pectoris   2. Mixed hyperlipidemia   3. Essential hypertension   4. Chronic systolic heart failure    PLAN:    In order of problems listed above:  The patient is stable with no symptoms of angina.  He is encouraged to stay as active as possible.  He gets out and does yard work on a regular basis.  He will continue on aspirin for antiplatelet therapy and high intensity statin drug. Treated with atorvastatin 80 mg daily.  Last lipids with a cholesterol 138, HDL 44, LDL 72. Blood pressure is well-controlled on a regimen of carvedilol, losartan, and spironolactone. Appears well compensated with NYHA functional class II symptoms on carvedilol, losartan, and spironolactone.  LVEF has been in the 40 to 45% range.  He has not had any congestive symptoms.  With his bladder outlet problems and recent need for indwelling Foley  catheter, I have not prescribed Jardiance due to concern over increased risk of complicated urinary infection.     Medication Adjustments/Labs and Tests Ordered: Current medicines are reviewed at length with the patient today.  Concerns regarding medicines are outlined above.  No orders of the defined types were placed in this encounter.  No orders of the defined types were placed in this encounter.   There are no Patient Instructions on file for this visit.   Signed, [name], MD  04/05/2022 11:34 AM    [place]

## 2022-04-12 ENCOUNTER — Ambulatory Visit: Payer: Medicare Other | Admitting: Physician Assistant

## 2022-04-19 ENCOUNTER — Other Ambulatory Visit: Payer: Self-pay | Admitting: Cardiovascular Disease

## 2022-05-02 ENCOUNTER — Other Ambulatory Visit: Payer: Self-pay | Admitting: Cardiovascular Disease

## 2022-05-13 DIAGNOSIS — H1789 Other corneal scars and opacities: Secondary | ICD-10-CM | POA: Diagnosis not present

## 2022-05-13 DIAGNOSIS — H401230 Low-tension glaucoma, bilateral, stage unspecified: Secondary | ICD-10-CM | POA: Diagnosis not present

## 2022-05-13 DIAGNOSIS — H2512 Age-related nuclear cataract, left eye: Secondary | ICD-10-CM | POA: Diagnosis not present

## 2022-05-13 DIAGNOSIS — H25013 Cortical age-related cataract, bilateral: Secondary | ICD-10-CM | POA: Diagnosis not present

## 2022-06-27 DIAGNOSIS — I130 Hypertensive heart and chronic kidney disease with heart failure and stage 1 through stage 4 chronic kidney disease, or unspecified chronic kidney disease: Secondary | ICD-10-CM | POA: Diagnosis not present

## 2022-06-27 DIAGNOSIS — Z7982 Long term (current) use of aspirin: Secondary | ICD-10-CM | POA: Diagnosis not present

## 2022-07-11 DIAGNOSIS — R3912 Poor urinary stream: Secondary | ICD-10-CM | POA: Diagnosis not present

## 2022-07-11 DIAGNOSIS — N401 Enlarged prostate with lower urinary tract symptoms: Secondary | ICD-10-CM | POA: Diagnosis not present

## 2022-08-04 ENCOUNTER — Other Ambulatory Visit: Payer: Self-pay | Admitting: Cardiovascular Disease

## 2022-08-10 DIAGNOSIS — Z03818 Encounter for observation for suspected exposure to other biological agents ruled out: Secondary | ICD-10-CM | POA: Diagnosis not present

## 2022-08-10 DIAGNOSIS — J302 Other seasonal allergic rhinitis: Secondary | ICD-10-CM | POA: Diagnosis not present

## 2022-08-10 DIAGNOSIS — I5022 Chronic systolic (congestive) heart failure: Secondary | ICD-10-CM | POA: Diagnosis not present

## 2022-08-10 DIAGNOSIS — I130 Hypertensive heart and chronic kidney disease with heart failure and stage 1 through stage 4 chronic kidney disease, or unspecified chronic kidney disease: Secondary | ICD-10-CM | POA: Diagnosis not present

## 2022-08-10 DIAGNOSIS — N1831 Chronic kidney disease, stage 3a: Secondary | ICD-10-CM | POA: Diagnosis not present

## 2022-08-10 DIAGNOSIS — I2510 Atherosclerotic heart disease of native coronary artery without angina pectoris: Secondary | ICD-10-CM | POA: Diagnosis not present

## 2022-08-10 DIAGNOSIS — R051 Acute cough: Secondary | ICD-10-CM | POA: Diagnosis not present

## 2022-08-17 DIAGNOSIS — R5383 Other fatigue: Secondary | ICD-10-CM | POA: Diagnosis not present

## 2022-08-17 DIAGNOSIS — I509 Heart failure, unspecified: Secondary | ICD-10-CM | POA: Diagnosis not present

## 2022-08-17 DIAGNOSIS — N1831 Chronic kidney disease, stage 3a: Secondary | ICD-10-CM | POA: Diagnosis not present

## 2022-08-17 DIAGNOSIS — D649 Anemia, unspecified: Secondary | ICD-10-CM | POA: Diagnosis not present

## 2022-09-08 DIAGNOSIS — H524 Presbyopia: Secondary | ICD-10-CM | POA: Diagnosis not present

## 2022-09-23 DIAGNOSIS — H401233 Low-tension glaucoma, bilateral, severe stage: Secondary | ICD-10-CM | POA: Diagnosis not present

## 2022-10-18 DIAGNOSIS — I5022 Chronic systolic (congestive) heart failure: Secondary | ICD-10-CM | POA: Diagnosis not present

## 2022-10-18 DIAGNOSIS — N1831 Chronic kidney disease, stage 3a: Secondary | ICD-10-CM | POA: Diagnosis not present

## 2022-10-18 DIAGNOSIS — Z0000 Encounter for general adult medical examination without abnormal findings: Secondary | ICD-10-CM | POA: Diagnosis not present

## 2022-10-18 DIAGNOSIS — I2510 Atherosclerotic heart disease of native coronary artery without angina pectoris: Secondary | ICD-10-CM | POA: Diagnosis not present

## 2022-10-18 DIAGNOSIS — N400 Enlarged prostate without lower urinary tract symptoms: Secondary | ICD-10-CM | POA: Diagnosis not present

## 2023-01-18 ENCOUNTER — Telehealth: Payer: Self-pay | Admitting: Cardiovascular Disease

## 2023-01-18 DIAGNOSIS — R002 Palpitations: Secondary | ICD-10-CM

## 2023-01-18 NOTE — Telephone Encounter (Signed)
 Patient c/o Palpitations:  STAT if patient reporting lightheadedness, shortness of breath, or chest pain  How long have you had palpitations/irregular HR/ Afib? Are you having the symptoms now? On/off couple weeks  Are you currently experiencing lightheadedness, SOB or CP? no  Do you have a history of afib (atrial fibrillation) or irregular heart rhythm? yes  Have you checked your BP or HR? (document readings if available): no  Are you experiencing any other symptoms? no

## 2023-01-18 NOTE — Telephone Encounter (Signed)
 Pt states that this sensation has been going on for 2 weeks, no CP. When pt's wife asked how to describe the feeling, he was unable to give a true description and stated that he was "fine" but the wife states she is still concerned. Pt's BP while on the phone was 101/56 HR 65. Pt and pt's wife advised on ED precautions if pt begins to experience CP/ chest discomfort, increased HR, SOB. Pt and pt's wife verbalized understanding. Explained to pt this information will be forwarded to Dr. Arlester Ladd and his nurse.

## 2023-01-19 NOTE — Telephone Encounter (Signed)
Called and spoke with patient via speaker phone with his wife. Symptoms are not happening enough to catch daily. Agrees to 14 day monitor. Order placed at this time. He wants to come to office next week and have it placed. Routing to Florentina Addison to make her aware, pt planning to come 01/25/23 about 1pm.

## 2023-01-19 NOTE — Telephone Encounter (Signed)
Please order 3 day ZIO if patient having daily symptoms would like further evaluation. If infrequent symptoms, change to 14 day monitor. thanks

## 2023-01-19 NOTE — Addendum Note (Signed)
Addended by: Lars Mage on: 01/19/2023 03:34 PM   Modules accepted: Orders

## 2023-01-23 NOTE — Telephone Encounter (Signed)
Pt wife states she is returning another call. I told her I didn't see another note but if you needed anything to call her back, if not they will be there Wednesday at 1 pm

## 2023-01-25 ENCOUNTER — Ambulatory Visit: Payer: Medicare Other | Attending: Cardiovascular Disease

## 2023-01-25 DIAGNOSIS — R002 Palpitations: Secondary | ICD-10-CM | POA: Diagnosis not present

## 2023-01-25 NOTE — Progress Notes (Unsigned)
Zio serial # [id number] from office inventory applied to patient.

## 2023-02-13 DIAGNOSIS — R002 Palpitations: Secondary | ICD-10-CM | POA: Diagnosis not present

## 2023-03-08 DIAGNOSIS — H401233 Low-tension glaucoma, bilateral, severe stage: Secondary | ICD-10-CM | POA: Diagnosis not present

## 2023-03-08 DIAGNOSIS — H2513 Age-related nuclear cataract, bilateral: Secondary | ICD-10-CM | POA: Diagnosis not present

## 2023-03-28 DIAGNOSIS — L570 Actinic keratosis: Secondary | ICD-10-CM | POA: Diagnosis not present

## 2023-03-28 DIAGNOSIS — X32XXXD Exposure to sunlight, subsequent encounter: Secondary | ICD-10-CM | POA: Diagnosis not present

## 2023-05-01 ENCOUNTER — Other Ambulatory Visit: Payer: Self-pay | Admitting: Cardiovascular Disease

## 2023-05-19 DIAGNOSIS — H409 Unspecified glaucoma: Secondary | ICD-10-CM | POA: Diagnosis not present

## 2023-06-03 DIAGNOSIS — E7800 Pure hypercholesterolemia, unspecified: Secondary | ICD-10-CM | POA: Diagnosis not present

## 2023-06-03 DIAGNOSIS — N401 Enlarged prostate with lower urinary tract symptoms: Secondary | ICD-10-CM | POA: Diagnosis not present

## 2023-06-03 DIAGNOSIS — N1831 Chronic kidney disease, stage 3a: Secondary | ICD-10-CM | POA: Diagnosis not present

## 2023-06-03 DIAGNOSIS — I5022 Chronic systolic (congestive) heart failure: Secondary | ICD-10-CM | POA: Diagnosis not present

## 2023-07-29 ENCOUNTER — Other Ambulatory Visit: Payer: Self-pay | Admitting: Cardiovascular Disease

## 2023-07-30 ENCOUNTER — Other Ambulatory Visit: Payer: Self-pay | Admitting: Cardiovascular Disease

## 2023-08-02 ENCOUNTER — Other Ambulatory Visit: Payer: Self-pay | Admitting: Cardiovascular Disease

## 2023-08-03 DIAGNOSIS — H25013 Cortical age-related cataract, bilateral: Secondary | ICD-10-CM | POA: Diagnosis not present

## 2023-08-03 DIAGNOSIS — E7800 Pure hypercholesterolemia, unspecified: Secondary | ICD-10-CM | POA: Diagnosis not present

## 2023-08-03 DIAGNOSIS — N1831 Chronic kidney disease, stage 3a: Secondary | ICD-10-CM | POA: Diagnosis not present

## 2023-08-03 DIAGNOSIS — H43813 Vitreous degeneration, bilateral: Secondary | ICD-10-CM | POA: Diagnosis not present

## 2023-08-03 DIAGNOSIS — H2513 Age-related nuclear cataract, bilateral: Secondary | ICD-10-CM | POA: Diagnosis not present

## 2023-08-03 DIAGNOSIS — I5022 Chronic systolic (congestive) heart failure: Secondary | ICD-10-CM | POA: Diagnosis not present

## 2023-08-03 DIAGNOSIS — N401 Enlarged prostate with lower urinary tract symptoms: Secondary | ICD-10-CM | POA: Diagnosis not present

## 2023-08-03 DIAGNOSIS — H401233 Low-tension glaucoma, bilateral, severe stage: Secondary | ICD-10-CM | POA: Diagnosis not present

## 2023-08-04 ENCOUNTER — Telehealth: Payer: Self-pay | Admitting: Cardiovascular Disease

## 2023-08-04 NOTE — Telephone Encounter (Signed)
*  STAT* If patient is at the pharmacy, call can be transferred to refill team.   1. Which medications need to be refilled? (please list name of each medication and dose if known)   carvedilol  (COREG ) 6.25 MG tablet    4. Which pharmacy/location (including street and city if local pharmacy) is medication to be sent to?  [id number] [place], [place] - [address] [facility] [address] [facility] [address] Phone: [phone]  Fax: [phone]       5. Do they need a 30 day or 90 day supply? 90

## 2023-08-26 ENCOUNTER — Other Ambulatory Visit: Payer: Self-pay | Admitting: Cardiovascular Disease

## 2023-08-29 ENCOUNTER — Other Ambulatory Visit: Payer: Self-pay

## 2023-08-29 MED ORDER — SPIRONOLACTONE 25 MG PO TABS: 25.0000 mg | ORAL_TABLET | Freq: Every day | ORAL | 2 refills | Status: AC

## 2023-08-31 DIAGNOSIS — R059 Cough, unspecified: Secondary | ICD-10-CM | POA: Diagnosis not present

## 2023-09-03 DIAGNOSIS — N1831 Chronic kidney disease, stage 3a: Secondary | ICD-10-CM | POA: Diagnosis not present

## 2023-09-03 DIAGNOSIS — E7800 Pure hypercholesterolemia, unspecified: Secondary | ICD-10-CM | POA: Diagnosis not present

## 2023-09-03 DIAGNOSIS — N401 Enlarged prostate with lower urinary tract symptoms: Secondary | ICD-10-CM | POA: Diagnosis not present

## 2023-09-03 DIAGNOSIS — I5022 Chronic systolic (congestive) heart failure: Secondary | ICD-10-CM | POA: Diagnosis not present

## 2023-09-21 DIAGNOSIS — N400 Enlarged prostate without lower urinary tract symptoms: Secondary | ICD-10-CM | POA: Diagnosis not present

## 2023-09-21 DIAGNOSIS — Z1331 Encounter for screening for depression: Secondary | ICD-10-CM | POA: Diagnosis not present

## 2023-09-21 DIAGNOSIS — I10 Essential (primary) hypertension: Secondary | ICD-10-CM | POA: Diagnosis not present

## 2023-09-21 DIAGNOSIS — Z0000 Encounter for general adult medical examination without abnormal findings: Secondary | ICD-10-CM | POA: Diagnosis not present

## 2023-09-21 DIAGNOSIS — I5022 Chronic systolic (congestive) heart failure: Secondary | ICD-10-CM | POA: Diagnosis not present

## 2023-09-21 DIAGNOSIS — I2510 Atherosclerotic heart disease of native coronary artery without angina pectoris: Secondary | ICD-10-CM | POA: Diagnosis not present

## 2023-09-21 DIAGNOSIS — N1831 Chronic kidney disease, stage 3a: Secondary | ICD-10-CM | POA: Diagnosis not present

## 2023-10-03 DIAGNOSIS — N401 Enlarged prostate with lower urinary tract symptoms: Secondary | ICD-10-CM | POA: Diagnosis not present

## 2023-10-03 DIAGNOSIS — I5022 Chronic systolic (congestive) heart failure: Secondary | ICD-10-CM | POA: Diagnosis not present

## 2023-10-03 DIAGNOSIS — E7800 Pure hypercholesterolemia, unspecified: Secondary | ICD-10-CM | POA: Diagnosis not present

## 2023-10-03 DIAGNOSIS — N1831 Chronic kidney disease, stage 3a: Secondary | ICD-10-CM | POA: Diagnosis not present

## 2023-10-25 ENCOUNTER — Other Ambulatory Visit: Payer: Self-pay | Admitting: Cardiovascular Disease

## 2023-10-27 ENCOUNTER — Other Ambulatory Visit: Payer: Self-pay | Admitting: Cardiovascular Disease

## 2023-10-31 ENCOUNTER — Other Ambulatory Visit: Payer: Self-pay | Admitting: Cardiovascular Disease

## 2023-11-03 DIAGNOSIS — N1831 Chronic kidney disease, stage 3a: Secondary | ICD-10-CM | POA: Diagnosis not present

## 2023-11-03 DIAGNOSIS — E7800 Pure hypercholesterolemia, unspecified: Secondary | ICD-10-CM | POA: Diagnosis not present

## 2023-11-03 DIAGNOSIS — N401 Enlarged prostate with lower urinary tract symptoms: Secondary | ICD-10-CM | POA: Diagnosis not present

## 2023-11-03 DIAGNOSIS — R3912 Poor urinary stream: Secondary | ICD-10-CM | POA: Diagnosis not present

## 2023-11-03 DIAGNOSIS — I5022 Chronic systolic (congestive) heart failure: Secondary | ICD-10-CM | POA: Diagnosis not present

## 2023-11-28 ENCOUNTER — Ambulatory Visit: Admitting: Cardiovascular Disease

## 2023-12-03 DIAGNOSIS — I5022 Chronic systolic (congestive) heart failure: Secondary | ICD-10-CM | POA: Diagnosis not present

## 2023-12-03 DIAGNOSIS — N1831 Chronic kidney disease, stage 3a: Secondary | ICD-10-CM | POA: Diagnosis not present

## 2023-12-03 DIAGNOSIS — E7800 Pure hypercholesterolemia, unspecified: Secondary | ICD-10-CM | POA: Diagnosis not present

## 2023-12-03 DIAGNOSIS — N401 Enlarged prostate with lower urinary tract symptoms: Secondary | ICD-10-CM | POA: Diagnosis not present

## 2024-01-26 ENCOUNTER — Other Ambulatory Visit: Payer: Self-pay | Admitting: Cardiovascular Disease

## 2024-02-02 ENCOUNTER — Other Ambulatory Visit: Payer: Self-pay | Admitting: Cardiovascular Disease

## 2024-02-02 NOTE — Telephone Encounter (Signed)
 In accordance with refill protocols, please review and address the following requirements before this medication refill can be authorized:  Appointment  and Labs  Pt had an upcoming appt with Dr. Wonda on 02/08/2024, pt needs normal labs for Cr, ALT, AST and Lipid panel. Medication atorvastatin  sent to pharmacy, enough to get pt to appt. Please advise on medication carvedilol , base on the other labs levels

## 2024-02-05 ENCOUNTER — Other Ambulatory Visit: Payer: Self-pay | Admitting: Cardiovascular Disease

## 2024-02-07 ENCOUNTER — Other Ambulatory Visit: Payer: Self-pay | Admitting: Cardiovascular Disease

## 2024-02-08 ENCOUNTER — Encounter: Payer: Self-pay | Admitting: Cardiovascular Disease

## 2024-02-08 ENCOUNTER — Ambulatory Visit: Admitting: Cardiovascular Disease

## 2024-02-08 VITALS — BP 101/66 | HR 59 | Ht 70.0 in | Wt 154.8 lb

## 2024-02-08 DIAGNOSIS — I10 Essential (primary) hypertension: Secondary | ICD-10-CM | POA: Diagnosis not present

## 2024-02-08 DIAGNOSIS — I2510 Atherosclerotic heart disease of native coronary artery without angina pectoris: Secondary | ICD-10-CM | POA: Diagnosis not present

## 2024-02-08 DIAGNOSIS — E782 Mixed hyperlipidemia: Secondary | ICD-10-CM

## 2024-02-08 DIAGNOSIS — I5022 Chronic systolic (congestive) heart failure: Secondary | ICD-10-CM

## 2024-02-08 MED ORDER — CARVEDILOL 6.25 MG PO TABS: 6.2500 mg | ORAL_TABLET | Freq: Two times a day (BID) | ORAL | 3 refills | Status: AC

## 2024-02-08 NOTE — Assessment & Plan Note (Addendum)
 The patient continues to have NYHA functional class I symptoms.  No evidence of volume overload.  Continue carvedilol , spironolactone , and losartan  at current doses.  With his advanced age, good functional status, and prostate issues, we have decided to avoid SGLT2 inhibitors.

## 2024-02-08 NOTE — Progress Notes (Signed)
 " Cardiology Office Note:    Date:  02/08/2024   ID:  Jesus Warner, DOB Aug 05, 1935, MRN [id number]  PCP:  Rexanne Ingle, MD   [facility] Providers Cardiologist:  Ozell Fell, MD     Referring MD: Rexanne Ingle, MD   Chief Complaint  Patient presents with   Coronary Artery Disease    History of Present Illness:    Jesus Warner is a 89 y.o. male with a hx of coronary artery disease, presenting for follow-up evaluation.  Comorbid conditions include chronic HFrEF, hypertension, hyperlipidemia, and left bundle branch block.  The patient initially presented with an anterior MI in 2014 treated with primary PCI using a drug-eluting stent in the LAD.  LVEF was severely depressed at less than 30% but improved to 45% range on follow-up studies.   He follows regularly with Dr. Rexanne in the [place] practice.  He feels well and has no complaints today.  He got out and shoveling some snow recently.  He denies chest pain, chest pressure, or shortness of breath.  Patient is compliant with his medications.  He denies lightheadedness, heart palpitations, or presyncope.   Current Medications: Active Medications[1]   Allergies:   Patient has no known allergies.   ROS:   Please see the history of present illness.    All other systems reviewed and are negative.  EKGs/Labs/Other Studies Reviewed:    The following studies were reviewed today: Cardiac Studies & Procedures   ______________________________________________________________________________________________     ECHOCARDIOGRAM  ECHOCARDIOGRAM COMPLETE 07/15/2019  Narrative ECHOCARDIOGRAM REPORT    Patient Name:   Jesus Warner [facility] Date of Exam: 07/15/2019 Medical Rec #:  [id number]        Height:       70.0 in Accession #:    [id number]       Weight:       163.0 lb Date of Birth:  1935/10/09        BSA:          1.914 m Patient Age:    84 years         BP:           112/66 mmHg Patient Gender: M                 HR:           49 bpm. Exam Location:  [address]  Procedure: 2D Echo, 3D Echo, Cardiac Doppler, Color Doppler and Strain Analysis  Indications:    I50.22 CHF  History:        Patient has prior history of Echocardiogram examinations, most recent 04/06/2015. Cardiomyopathy, Previous Myocardial Infarction and CAD, Arrythmias:LBBB; Risk Factors:Hypertension and Dyslipidemia.  Sonographer:    Marshia Rea RAMAN, RDCS Referring Phys: [phone] Ajooni Karam  IMPRESSIONS   1. There has been no significant change since the prior study on 04/06/2015. 2. Left ventricular ejection fraction, by estimation, is 40 to 45%. The left ventricle has mildly decreased function. The left ventricle demonstrates global hypokinesis. Left ventricular diastolic parameters are consistent with Grade I diastolic dysfunction (impaired relaxation). 3. Right ventricular systolic function is normal. The right ventricular size is normal. There is mildly elevated pulmonary artery systolic pressure. The estimated right ventricular systolic pressure is 36.6 mmHg. 4. The mitral valve is normal in structure. No evidence of mitral valve regurgitation. No evidence of mitral stenosis. 5. Tricuspid valve regurgitation is moderate. 6. The aortic valve is normal in structure. Aortic valve regurgitation is not visualized.  No aortic stenosis is present. 7. The inferior vena cava is normal in size with greater than 50% respiratory variability, suggesting right atrial pressure of 3 mmHg.  Comparison(s): 04/06/15 EF 40-45%. PA pressure .  FINDINGS Left Ventricle: Left ventricular ejection fraction, by estimation, is 40 to 45%. The left ventricle has mildly decreased function. The left ventricle demonstrates global hypokinesis. The left ventricular internal cavity size was normal in size. There is no left ventricular hypertrophy. Abnormal (paradoxical) septal motion, consistent with left bundle branch block. Left ventricular diastolic  parameters are consistent with Grade I diastolic dysfunction (impaired relaxation). Normal left ventricular filling pressure.   LV Wall Scoring: The entire anterior septum is hypokinetic.  Right Ventricle: The right ventricular size is normal. No increase in right ventricular wall thickness. Right ventricular systolic function is normal. There is mildly elevated pulmonary artery systolic pressure. The tricuspid regurgitant velocity is 2.90 m/s, and with an assumed right atrial pressure of 3 mmHg, the estimated right ventricular systolic pressure is 36.6 mmHg.  Left Atrium: Left atrial size was normal in size.  Right Atrium: Right atrial size was normal in size.  Pericardium: There is no evidence of pericardial effusion.  Mitral Valve: The mitral valve is normal in structure. Normal mobility of the mitral valve leaflets. No evidence of mitral valve regurgitation. No evidence of mitral valve stenosis.  Tricuspid Valve: The tricuspid valve is normal in structure. Tricuspid valve regurgitation is moderate . No evidence of tricuspid stenosis.  Aortic Valve: The aortic valve is normal in structure. Aortic valve regurgitation is not visualized. No aortic stenosis is present.  Pulmonic Valve: The pulmonic valve was normal in structure. Pulmonic valve regurgitation is not visualized. No evidence of pulmonic stenosis.  Aorta: The aortic root is normal in size and structure.  Venous: The inferior vena cava is normal in size with greater than 50% respiratory variability, suggesting right atrial pressure of 3 mmHg.  IAS/Shunts: No atrial level shunt detected by color flow Doppler.   LEFT VENTRICLE PLAX 2D LVIDd:         4.70 cm  Diastology LVIDs:         3.40 cm  LV e' lateral:   11.40 cm/s LV PW:         0.80 cm  LV E/e' lateral: 3.6 LV IVS:        0.80 cm  LV e' medial:    3.89 cm/s LVOT diam:     2.00 cm  LV E/e' medial:  10.5 LV SV:         61 LV SV Index:   32       2D Longitudinal  Strain LVOT Area:     3.14 cm 2D Strain GLS (A2C):   -21.8 % 2D Strain GLS (A3C):   -15.6 % 2D Strain GLS (A4C):   -22.2 % 2D Strain GLS Avg:     -19.9 %  3D Volume EF: 3D EF:        51 % LV EDV:       112 ml LV ESV:       55 ml LV SV:        57 ml  RIGHT VENTRICLE RV Basal diam:  2.40 cm RV S prime:     8.76 cm/s TAPSE (M-mode): 1.7 cm RVSP:           36.6 mmHg  LEFT ATRIUM             Index  RIGHT ATRIUM           Index LA diam:        3.40 cm 1.78 cm/m  RA Pressure: 3.00 mmHg LA Vol (A2C):   18.0 ml 9.41 ml/m  RA Area:     11.00 cm LA Vol (A4C):   25.5 ml 13.33 ml/m RA Volume:   22.40 ml  11.71 ml/m LA Biplane Vol: 22.8 ml 11.92 ml/m AORTIC VALVE LVOT Vmax:   69.20 cm/s LVOT Vmean:  46.900 cm/s LVOT VTI:    0.194 m  AORTA Ao Root diam: 4.00 cm Ao Asc diam:  3.70 cm  MITRAL VALVE               TRICUSPID VALVE TR Peak grad:   33.6 mmHg TR Vmax:        290.00 cm/s MV E velocity: 40.70 cm/s  Estimated RAP:  3.00 mmHg MV A velocity: 61.30 cm/s  RVSP:           36.6 mmHg MV E/A ratio:  0.66 SHUNTS Systemic VTI:  0.19 m Systemic Diam: 2.00 cm  Leim Moose MD Electronically signed by Leim Moose MD Signature Date/Time: 07/15/2019/3:50:59 PM    Final    MONITORS  LONG TERM MONITOR (3-14 DAYS) 02/13/2023  Narrative Patch Wear Time:  13 days and 19 hours (2025-01-22T13:26:24-0500 to 2025-02-05T08:55:48-0500)  Patient had a min HR of 39 bpm, max HR of 179 bpm, and avg HR of 70 bpm. Predominant underlying rhythm was Sinus Rhythm. First Degree AV Block was present. Bundle Branch Block/IVCD was present. 1 run of Ventricular Tachycardia occurred lasting 6 beats with a max rate of 179 bpm (avg 163 bpm). 17 Supraventricular Tachycardia runs occurred, the run with the fastest interval lasting 7 beats with a max rate of 150 bpm, the longest lasting 15 beats with an avg rate of 134 bpm. Some episodes of Supraventricular Tachycardia may be possible Atrial  Tachycardia with variable block. Isolated SVEs were rare (<1.0%), SVE Couplets were rare (<1.0%), and SVE Triplets were rare (<1.0%). Isolated VEs were rare (<1.0%, 10737), VE Couplets were rare (<1.0%, 241), and VE Triplets were rare (<1.0%, 1). Ventricular Bigeminy and Trigeminy were present.  SUMMARY: The basic rhythm is normal sinus with an average HR of 70 bpm. <1% PVC's and PAC's. One 6 beat run of NSVT, no afib or flutter, otherwise as above.       ______________________________________________________________________________________________      EKG:   EKG Interpretation Date/Time:  Thursday February 08 2024 14:14:27 EST Ventricular Rate:  59 PR Interval:  228 QRS Duration:  132 QT Interval:  438 QTC Calculation: 433 R Axis:   -44  Text Interpretation: Sinus bradycardia with 1st degree A-V block Left axis deviation Left bundle branch block When compared with ECG of 09-Sep-2012 08:04, Left bundle branch block is now Present Confirmed by Wonda Sharper [phone]) on 02/08/2024 2:30:40 PM    Recent Labs: No results found for requested labs within last 365 days.  Recent Lipid Panel    Component Value Date/Time   CHOL 166 12/23/2013 0805   TRIG 103.0 12/23/2013 0805   HDL 38.10 (L) 12/23/2013 0805   CHOLHDL 4 12/23/2013 0805   VLDL 20.6 12/23/2013 0805   LDLCALC 107 (H) 12/23/2013 0805     Risk Assessment/Calculations:                Physical Exam:    VS:  BP 101/66 (BP Location: Left Arm)   Pulse (!) 59  Ht 5' 10 (1.778 m)   Wt 154 lb 12.8 oz (70.2 kg)   SpO2 97%   BMI 22.21 kg/m     Wt Readings from Last 3 Encounters:  02/08/24 154 lb 12.8 oz (70.2 kg)  04/05/22 153 lb 3.2 oz (69.5 kg)  02/11/22 151 lb 3.8 oz (68.6 kg)     GEN:  Well nourished, well developed pleasant elderly male in no acute distress HEENT: Normal NECK: No JVD; No carotid bruits LYMPHATICS: No lymphadenopathy CARDIAC: RRR, no murmurs, rubs, gallops RESPIRATORY:  Clear to  auscultation without rales, wheezing or rhonchi  ABDOMEN: Soft, non-tender, non-distended MUSCULOSKELETAL:  No edema; No deformity  SKIN: Warm and dry NEUROLOGIC:  Alert and oriented x 3 PSYCHIATRIC:  Normal affect   Assessment & Plan Coronary artery disease involving native coronary artery of native heart without angina pectoris The patient is stable without any symptoms of angina.  He continues on aspirin  and statin drug. Mixed hyperlipidemia Treated with atorvastatin  80 mg daily.  Lipids followed by PCP.  Goal LDL cholesterol less than 70 mg/dL. Essential hypertension Blood pressure is under optimal control on a combination of losartan  and carvedilol .  Carvedilol  refilled today. Chronic systolic heart failure (HCC) The patient continues to have NYHA functional class I symptoms.  No evidence of volume overload.  Continue carvedilol , spironolactone , and losartan  at current doses.  With his advanced age, good functional status, and prostate issues, we have decided to avoid SGLT2 inhibitors.            Medication Adjustments/Labs and Tests Ordered: Current medicines are reviewed at length with the patient today.  Concerns regarding medicines are outlined above.  Orders Placed This Encounter  Procedures   EKG 12-Lead   Meds ordered this encounter  Medications   carvedilol  (COREG ) 6.25 MG tablet    Sig: Take 1 tablet (6.25 mg total) by mouth 2 (two) times daily with a meal.    Dispense:  180 tablet    Refill:  3    Pt must keep scheduled followup appt with Cardiology in November 2025 for any more refills. Thank You    Patient Instructions  Medication Instructions:  No medication changes were made at this visit. Continue current regimen.   *If you need a refill on your cardiac medications before your next appointment, please call your pharmacy*  Lab Work: None ordered today. If you have labs (blood work) drawn today and your tests are completely normal, you will receive your  results only by: MyChart Message (if you have MyChart) OR A paper copy in the mail If you have any lab test that is abnormal or we need to change your treatment, we will call you to review the results.  Testing/Procedures: None ordered today.  Follow-Up: At [facility], you and your health needs are our priority.  As part of our continuing mission to provide you with exceptional heart care, our providers are all part of one team.  This team includes your primary Cardiologist (physician) and Advanced Practice Providers or APPs (Physician Assistants and Nurse Practitioners) who all work together to provide you with the care you need, when you need it.  Your next appointment:   1 year(s)  Provider:   Ozell Fell, MD     Signed, Ozell Fell, MD  02/08/2024 5:03 PM    [facility]     [1]  Current Meds  Medication Sig   Ascorbic Acid (VITAMIN C) 1000 MG tablet Take 1,000 mg by  mouth daily.   aspirin  81 MG tablet Take 81 mg by mouth daily.   atorvastatin  (LIPITOR ) 80 MG tablet TAKE 1 TABLET(80 MG) BY MOUTH DAILY   cetirizine (ZYRTEC) 10 MG tablet Take by mouth as needed.   doxycycline (VIBRAMYCIN) 100 MG capsule Take 100 mg by mouth 2 (two) times daily.   famotidine  (PEPCID ) 20 MG tablet Take 20 mg by mouth in the morning.   finasteride  (PROSCAR ) 5 MG tablet Take 5 mg by mouth in the morning.   fluticasone  (FLONASE ) 50 MCG/ACT nasal spray Place 1 spray into both nostrils in the morning.   latanoprost  (XALATAN ) 0.005 % ophthalmic solution Place 1 drop into both eyes at bedtime.   losartan  (COZAAR ) 25 MG tablet Take 25 mg by mouth daily.   nitroGLYCERIN  (NITROSTAT ) 0.4 MG SL tablet Place 1 tablet (0.4 mg total) under the tongue every 5 (five) minutes x 3 doses as needed for chest pain.   spironolactone  (ALDACTONE ) 25 MG tablet Take 1 tablet (25 mg total) by mouth daily.   tamsulosin  (FLOMAX ) 0.4 MG CAPS capsule Take 0.4 mg by mouth every evening.    [DISCONTINUED] carvedilol  (COREG ) 6.25 MG tablet TAKE 1 TABLET BY MOUTH TWICE DAILY WITH MEALS   "

## 2024-02-08 NOTE — Patient Instructions (Signed)

## 2024-02-08 NOTE — Assessment & Plan Note (Addendum)
 Blood pressure is under optimal control on a combination of losartan  and carvedilol .  Carvedilol  refilled today.

## 2024-02-08 NOTE — Assessment & Plan Note (Addendum)
 The patient is stable without any symptoms of angina.  He continues on aspirin  and statin drug.

## 2024-03-01 ENCOUNTER — Ambulatory Visit: Admitting: Cardiovascular Disease
